# Patient Record
Sex: Female | Born: 1980 | Hispanic: No | Marital: Married | Smoking: Never smoker
Health system: Southern US, Community
[De-identification: ages and names within clinical notes are randomized; demographics above are authoritative.]

## PROBLEM LIST (undated history)

## (undated) DIAGNOSIS — R1011 Right upper quadrant pain: Secondary | ICD-10-CM

## (undated) HISTORY — PX: ORTHOPEDIC SURGERY: SHX850

## (undated) HISTORY — PX: TUBAL LIGATION: SHX77

## (undated) HISTORY — DX: Right upper quadrant pain: R10.11

---

## 2014-02-11 DIAGNOSIS — E559 Vitamin D deficiency, unspecified: Secondary | ICD-10-CM | POA: Insufficient documentation

## 2014-07-02 ENCOUNTER — Encounter: Payer: Self-pay | Admitting: Internal Medicine

## 2014-07-02 DIAGNOSIS — E039 Hypothyroidism, unspecified: Secondary | ICD-10-CM | POA: Insufficient documentation

## 2014-07-02 DIAGNOSIS — R1011 Right upper quadrant pain: Secondary | ICD-10-CM | POA: Insufficient documentation

## 2014-07-02 DIAGNOSIS — R634 Abnormal weight loss: Secondary | ICD-10-CM | POA: Insufficient documentation

## 2014-07-02 DIAGNOSIS — D485 Neoplasm of uncertain behavior of skin: Secondary | ICD-10-CM | POA: Insufficient documentation

## 2014-07-02 HISTORY — DX: Right upper quadrant pain: R10.11

## 2014-07-06 ENCOUNTER — Ambulatory Visit
Admit: 2014-07-06 | Disposition: A | Discharge status: Home / Self Care | Payer: Self-pay | Attending: Internal Medicine | Admitting: Internal Medicine

## 2014-09-02 ENCOUNTER — Ambulatory Visit (INDEPENDENT_AMBULATORY_CARE_PROVIDER_SITE_OTHER): Payer: PRIVATE HEALTH INSURANCE | Admitting: Internal Medicine

## 2014-09-02 ENCOUNTER — Other Ambulatory Visit: Payer: Self-pay | Admitting: Internal Medicine

## 2014-09-02 ENCOUNTER — Encounter: Payer: Self-pay | Admitting: Internal Medicine

## 2014-09-02 VITALS — BP 98/60 | HR 72 | Ht 62.0 in | Wt 135.8 lb

## 2014-09-02 DIAGNOSIS — Z0000 Encounter for general adult medical examination without abnormal findings: Secondary | ICD-10-CM | POA: Diagnosis not present

## 2014-09-02 DIAGNOSIS — E559 Vitamin D deficiency, unspecified: Secondary | ICD-10-CM

## 2014-09-02 DIAGNOSIS — E039 Hypothyroidism, unspecified: Secondary | ICD-10-CM

## 2014-09-02 LAB — POCT URINALYSIS DIPSTICK
BILIRUBIN UA: NEGATIVE
GLUCOSE UA: NEGATIVE
Ketones, UA: NEGATIVE
Leukocytes, UA: NEGATIVE
Nitrite, UA: NEGATIVE
Protein, UA: NEGATIVE
SPEC GRAV UA: 1.01
UROBILINOGEN UA: 0.2
pH, UA: 5

## 2014-09-02 NOTE — Progress Notes (Signed)
Date:  09/02/2014   Name:  Meghan Stewart   DOB:  [birth year]   MRN:  [id number]   Chief Complaint: Annual Exam Meghan Stewart is a 34 y.o. female who presents today for her Complete Annual Exam. She feels well. She reports exercising every day - walking and yoga. She reports she is sleeping well.  She is concerned that she does not lose weight despite diet and exercise.  Especially bothered by excess loose skin on her abdomen. Hypothyroidism - followed by endocrinology.  Recent TSH was normal and dose of medication is unchanged.  Also had a vitamin D level which was low so she started high dose vitamin D2 weekly. Review of Systems:  Review of Systems  Constitutional: Negative for fever, activity change, appetite change and unexpected weight change.  HENT: Negative for ear pain, hearing loss, trouble swallowing and voice change.   Eyes: Negative for visual disturbance.  Respiratory: Negative for chest tightness and shortness of breath.   Cardiovascular: Negative for chest pain and leg swelling.  Gastrointestinal: Positive for abdominal pain (occasional gas). Negative for nausea, diarrhea and abdominal distention.  Genitourinary: Negative for dysuria, menstrual problem and pelvic pain.  Musculoskeletal: Negative for back pain, joint swelling and gait problem.  Neurological: Positive for headaches (occasional mild).  Hematological: Negative for adenopathy.  Psychiatric/Behavioral: Negative for dysphoric mood.    Patient Active Problem List   Diagnosis Date Noted  . Acquired hypothyroidism 07/02/2014  . Abdominal pain, right upper quadrant 07/02/2014  . Neoplasm of uncertain behavior of skin 07/02/2014  . Avitaminosis D 02/11/2014    Prior to Admission medications   Medication Sig Start Date End Date Taking? Authorizing Provider  Calcium Carb-Cholecalciferol (CALCIUM + D3) 600-200 MG-UNIT TABS Take 1 tablet by mouth daily.    Historical Provider, MD  Fish Oil-Cholecalciferol (FISH OIL + D3)  1000-1000 MG-UNIT CAPS Take 1 capsule by mouth daily.    Historical Provider, MD  levothyroxine (SYNTHROID, LEVOTHROID) 88 MCG tablet Take 1 tablet by mouth daily.    Historical Provider, MD  Vitamin D, Ergocalciferol, (DRISDOL) 50000 UNITS CAPS capsule Take 1 capsule by mouth once a week. 08/26/14   Historical Provider, MD    No Known Allergies  Past Surgical History  Procedure Laterality Date  . Cesarean section      x 2  . Tubal ligation      History  Substance Use Topics  . Smoking status: Never Smoker   . Smokeless tobacco: Not on file  . Alcohol Use: No     Medication list has been reviewed and updated.  Physical Examination:  Physical Exam  Constitutional: She is oriented to person, place, and time. She appears well-developed. No distress.  HENT:  Head: Normocephalic and atraumatic.  Eyes: Conjunctivae are normal. Pupils are equal, round, and reactive to light. Right eye exhibits no discharge. Left eye exhibits no discharge. No scleral icterus.  Neck: Normal range of motion. Neck supple. No thyromegaly present.  Cardiovascular: Normal rate, regular rhythm, normal heart sounds and intact distal pulses.   Pulmonary/Chest: Effort normal and breath sounds normal. No respiratory distress. She has no wheezes. She has no rales.  Abdominal: Soft. Bowel sounds are normal. She exhibits no distension and no mass. There is no tenderness. There is no guarding.  Genitourinary: Vagina normal and uterus normal. No breast swelling, tenderness, discharge or bleeding. Cervix exhibits no motion tenderness, no discharge and no friability. Right adnexum displays no mass and no tenderness. Left adnexum displays no mass and  no tenderness.  Musculoskeletal: Normal range of motion. She exhibits no edema.  Lymphadenopathy:    She has no cervical adenopathy.    She has no axillary adenopathy.  Neurological: She is alert and oriented to person, place, and time. She has normal reflexes.  Skin: Skin  is warm, dry and intact. No rash noted.  Psychiatric: She has a normal mood and affect. Her speech is normal and behavior is normal. Thought content normal.    BP 98/60 mmHg  Pulse 72  Ht 5\' 2"  (1.575 m)  Wt 135 lb 12.8 oz (61.598 kg)  BMI 24.83 kg/m2  LMP 08/28/2014  Assessment and Plan: 1. Annual physical exam Normal exam - POCT urinalysis dipstick - CBC with Differential/Platelet - Comprehensive metabolic panel - Lipid panel - Pap IG (Image Guided)  2. Avitaminosis D supplemented  3. Acquired hypothyroidism Followed by Endocrinology   Halina Maidens, MD [facility]  09/02/2014

## 2014-09-02 NOTE — Patient Instructions (Signed)

## 2014-09-03 LAB — LIPID PANEL
CHOL/HDL RATIO: 4.5 ratio — AB (ref 0.0–4.4)
Cholesterol, Total: 178 mg/dL (ref 100–199)
HDL: 40 mg/dL (ref >39)
LDL Calculated: 119 mg/dL — ABNORMAL HIGH (ref 0–99)
Triglycerides: 97 mg/dL (ref 0–149)
VLDL Cholesterol Cal: 19 mg/dL (ref 5–40)

## 2014-09-03 LAB — CBC WITH DIFFERENTIAL/PLATELET
Basophils Absolute: 0 10*3/uL (ref 0.0–0.2)
Basos: 0 %
EOS (ABSOLUTE): 0.1 10*3/uL (ref 0.0–0.4)
Eos: 1 %
HEMATOCRIT: 40.9 % (ref 34.0–46.6)
Hemoglobin: 13.4 g/dL (ref 11.1–15.9)
IMMATURE GRANULOCYTES: 0 %
Immature Grans (Abs): 0 10*3/uL (ref 0.0–0.1)
LYMPHS ABS: 1.9 10*3/uL (ref 0.7–3.1)
LYMPHS: 37 %
MCH: 30.1 pg (ref 26.6–33.0)
MCHC: 32.8 g/dL (ref 31.5–35.7)
MCV: 92 fL (ref 79–97)
Monocytes Absolute: 0.4 10*3/uL (ref 0.1–0.9)
Monocytes: 7 %
NEUTROS ABS: 2.7 10*3/uL (ref 1.4–7.0)
Neutrophils: 55 %
Platelets: 146 10*3/uL — ABNORMAL LOW (ref 150–379)
RBC: 4.45 x10E6/uL (ref 3.77–5.28)
RDW: 14.1 % (ref 12.3–15.4)
WBC: 4.9 10*3/uL (ref 3.4–10.8)

## 2014-09-03 LAB — COMPREHENSIVE METABOLIC PANEL
ALT: 10 IU/L (ref 0–32)
AST: 12 IU/L (ref 0–40)
Albumin/Globulin Ratio: 1.7 (ref 1.1–2.5)
Albumin: 4.3 g/dL (ref 3.5–5.5)
Alkaline Phosphatase: 59 IU/L (ref 39–117)
BUN/Creatinine Ratio: 15 (ref 8–20)
BUN: 8 mg/dL (ref 6–20)
Bilirubin Total: 0.4 mg/dL (ref 0.0–1.2)
CALCIUM: 8.9 mg/dL (ref 8.7–10.2)
CO2: 22 mmol/L (ref 18–29)
CREATININE: 0.55 mg/dL — AB (ref 0.57–1.00)
Chloride: 100 mmol/L (ref 97–108)
GFR calc Af Amer: 140 mL/min/{1.73_m2} (ref >59)
GFR calc non Af Amer: 121 mL/min/{1.73_m2} (ref >59)
GLOBULIN, TOTAL: 2.6 g/dL (ref 1.5–4.5)
Glucose: 86 mg/dL (ref 65–99)
Potassium: 3.7 mmol/L (ref 3.5–5.2)
Sodium: 139 mmol/L (ref 134–144)
Total Protein: 6.9 g/dL (ref 6.0–8.5)

## 2014-09-04 LAB — PAP IG (IMAGE GUIDED): PAP Smear Comment: 0

## 2015-03-13 HISTORY — PX: OTHER SURGICAL HISTORY: SHX169

## 2017-01-15 DIAGNOSIS — E559 Vitamin D deficiency, unspecified: Secondary | ICD-10-CM | POA: Diagnosis not present

## 2017-01-15 DIAGNOSIS — E039 Hypothyroidism, unspecified: Secondary | ICD-10-CM | POA: Diagnosis not present

## 2017-01-17 DIAGNOSIS — E559 Vitamin D deficiency, unspecified: Secondary | ICD-10-CM | POA: Diagnosis not present

## 2017-01-17 DIAGNOSIS — E039 Hypothyroidism, unspecified: Secondary | ICD-10-CM | POA: Diagnosis not present

## 2018-01-20 DIAGNOSIS — E039 Hypothyroidism, unspecified: Secondary | ICD-10-CM | POA: Diagnosis not present

## 2018-01-20 DIAGNOSIS — E559 Vitamin D deficiency, unspecified: Secondary | ICD-10-CM | POA: Diagnosis not present

## 2018-01-24 DIAGNOSIS — E559 Vitamin D deficiency, unspecified: Secondary | ICD-10-CM | POA: Diagnosis not present

## 2018-01-24 DIAGNOSIS — E039 Hypothyroidism, unspecified: Secondary | ICD-10-CM | POA: Diagnosis not present

## 2018-04-08 ENCOUNTER — Other Ambulatory Visit (HOSPITAL_COMMUNITY)
Admission: RE | Admit: 2018-04-08 | Discharge: 2018-04-08 | Disposition: A | Discharge status: Home / Self Care | Payer: BLUE CROSS/BLUE SHIELD | Source: Ambulatory Visit | Attending: Internal Medicine | Admitting: Internal Medicine

## 2018-04-08 ENCOUNTER — Encounter: Payer: Self-pay | Admitting: Internal Medicine

## 2018-04-08 ENCOUNTER — Ambulatory Visit (INDEPENDENT_AMBULATORY_CARE_PROVIDER_SITE_OTHER): Payer: BLUE CROSS/BLUE SHIELD | Admitting: Internal Medicine

## 2018-04-08 VITALS — BP 112/64 | HR 101 | Ht 62.0 in | Wt 136.0 lb

## 2018-04-08 DIAGNOSIS — Z0000 Encounter for general adult medical examination without abnormal findings: Secondary | ICD-10-CM | POA: Diagnosis not present

## 2018-04-08 DIAGNOSIS — Z124 Encounter for screening for malignant neoplasm of cervix: Secondary | ICD-10-CM | POA: Insufficient documentation

## 2018-04-08 DIAGNOSIS — K5901 Slow transit constipation: Secondary | ICD-10-CM

## 2018-04-08 DIAGNOSIS — M722 Plantar fascial fibromatosis: Secondary | ICD-10-CM | POA: Diagnosis not present

## 2018-04-08 DIAGNOSIS — E039 Hypothyroidism, unspecified: Secondary | ICD-10-CM

## 2018-04-08 DIAGNOSIS — E559 Vitamin D deficiency, unspecified: Secondary | ICD-10-CM

## 2018-04-08 LAB — POCT URINALYSIS DIPSTICK
Bilirubin, UA: NEGATIVE
GLUCOSE UA: NEGATIVE
KETONES UA: NEGATIVE
LEUKOCYTES UA: NEGATIVE
Protein, UA: NEGATIVE
SPEC GRAV UA: 1.015 (ref 1.010–1.025)
Urobilinogen, UA: 0.2 E.U./dL
pH, UA: 5 (ref 5.0–8.0)

## 2018-04-08 NOTE — Patient Instructions (Signed)

## 2018-04-08 NOTE — Progress Notes (Signed)
Date:  04/08/2018   Name:  Meghan Stewart   DOB:  02-13-1981   MRN:  [id number]   Chief Complaint: Annual Exam Meghan Stewart is a 38 y.o. female who presents today for her Complete Annual Exam. She feels well. She reports exercising regularly. She reports she is sleeping well. She is due for a pap smear.  She has never had a mammogram.  She denies breast issues.  Her menses are regular - she has had a BTL.  Thyroid Problem  Presents for follow-up visit. Symptoms include constipation. Patient reports no anxiety, diarrhea, fatigue, palpitations or tremors. (Pt sees Endocrinology regularly for hypothyroidism.) The symptoms have been stable.  Foot Injury   The incident occurred at home. The injury mechanism was a twisting injury. The pain is present in the left foot. The pain is mild. The pain has been intermittent since onset. She reports no foreign bodies present. She has tried nothing (pain under left heel) for the symptoms.    Review of Systems  Constitutional: Negative for chills, fatigue and fever.  HENT: Negative for congestion, hearing loss, tinnitus, trouble swallowing and voice change.   Eyes: Negative for visual disturbance.  Respiratory: Negative for cough, chest tightness, shortness of breath and wheezing.   Cardiovascular: Negative for chest pain, palpitations and leg swelling.  Gastrointestinal: Positive for constipation. Negative for abdominal pain, diarrhea and vomiting.  Endocrine: Negative for polydipsia and polyuria.  Genitourinary: Negative for dysuria, frequency, genital sores, vaginal bleeding and vaginal discharge.  Musculoskeletal: Positive for arthralgias (pain on heel of left foot). Negative for gait problem and joint swelling.  Skin: Negative for color change and rash.  Neurological: Negative for dizziness, tremors, light-headedness and headaches.  Hematological: Negative for adenopathy. Does not bruise/bleed easily.  Psychiatric/Behavioral: Negative for dysphoric  mood and sleep disturbance. The patient is not nervous/anxious.     Patient Active Problem List   Diagnosis Date Noted  . Acquired hypothyroidism 07/02/2014  . Neoplasm of uncertain behavior of skin 07/02/2014  . Avitaminosis D 02/11/2014    No Known Allergies  Past Surgical History:  Procedure Laterality Date  . CESAREAN SECTION     x 2  . TUBAL LIGATION    . tummy tuck  2017    Social History   Tobacco Use  . Smoking status: Never Smoker  . Smokeless tobacco: Never Used  Substance Use Topics  . Alcohol use: Yes    Alcohol/week: 0.0 standard drinks    Comment: rarely   . Drug use: No     Medication list has been reviewed and updated.  Current Meds  Medication Sig  . Calcium Carb-Cholecalciferol (CALCIUM + D3) 600-200 MG-UNIT TABS Take 1 tablet by mouth daily.  . Fish Oil-Cholecalciferol (FISH OIL + D3) 1000-1000 MG-UNIT CAPS Take 1 capsule by mouth daily.  . Multiple Vitamins-Minerals (MULTIVITAMIN WITH MINERALS) tablet Take 1 tablet by mouth daily.    PHQ 2/9 Scores 04/08/2018  PHQ - 2 Score 0    Physical Exam Vitals signs and nursing note reviewed.  Constitutional:      General: She is not in acute distress.    Appearance: She is well-developed.  HENT:     Head: Normocephalic and atraumatic.     Right Ear: Tympanic membrane and ear canal normal.     Left Ear: Tympanic membrane and ear canal normal.     Nose:     Right Sinus: No maxillary sinus tenderness.     Left Sinus: No maxillary sinus  tenderness.     Mouth/Throat:     Pharynx: Uvula midline.  Eyes:     General: No scleral icterus.       Right eye: No discharge.        Left eye: No discharge.     Conjunctiva/sclera: Conjunctivae normal.  Neck:     Musculoskeletal: Normal range of motion. No erythema.     Thyroid: No thyromegaly.     Vascular: No carotid bruit.  Cardiovascular:     Rate and Rhythm: Normal rate and regular rhythm.     Pulses: Normal pulses.     Heart sounds: Normal heart  sounds.  Pulmonary:     Effort: Pulmonary effort is normal. No respiratory distress.     Breath sounds: No wheezing.  Chest:     Breasts:        Right: No mass, nipple discharge, skin change or tenderness.        Left: No mass, nipple discharge, skin change or tenderness.  Abdominal:     General: Bowel sounds are normal.     Palpations: Abdomen is soft.     Tenderness: There is no abdominal tenderness.     Hernia: There is no hernia in the right inguinal area or left inguinal area.  Genitourinary:    Labia:        Right: No rash, tenderness or lesion.        Left: No rash, tenderness or lesion.      Vagina: Normal.     Cervix: No friability or erythema.     Uterus: Normal.      Adnexa: Right adnexa normal and left adnexa normal.     Comments: Pap obtained Musculoskeletal: Normal range of motion.       Feet:  Lymphadenopathy:     Cervical: No cervical adenopathy.  Skin:    General: Skin is warm and dry.     Findings: No rash.  Neurological:     Mental Status: She is alert and oriented to person, place, and time.     Cranial Nerves: No cranial nerve deficit.     Sensory: No sensory deficit.     Deep Tendon Reflexes: Reflexes are normal and symmetric.  Psychiatric:        Attention and Perception: Attention normal.        Mood and Affect: Mood normal.        Speech: Speech normal.        Behavior: Behavior normal.        Thought Content: Thought content normal.     BP 112/64   Pulse (!) 101   Ht 5\' 2"  (1.575 m)   Wt 136 lb (61.7 kg)   LMP 04/03/2018 (Exact Date)   SpO2 98%   BMI 24.87 kg/m   Assessment and Plan: 1. Annual physical exam Normal exam; continue healthy diet and exercise Will need mammogram at age 56 - CBC with Differential/Platelet - Comprehensive metabolic panel - Lipid panel - POCT urinalysis dipstick  2. Acquired hypothyroidism supplemented  3. Avitaminosis D On supplemen  4. Encounter for Papanicolaou smear for cervical cancer  screening Obtained today - Cytology - PAP  5. Slow transit constipation Continue Miralax daily with water and fiber  6. Plantar fasciitis of left foot Recommend ice massage twice a day as needed   Partially dictated using Editor, commissioning. Any errors are unintentional.  Halina Maidens, MD [facility]  04/08/2018

## 2018-04-09 LAB — COMPREHENSIVE METABOLIC PANEL
ALT: 25 IU/L (ref 0–32)
AST: 20 IU/L (ref 0–40)
Albumin/Globulin Ratio: 1.9 (ref 1.2–2.2)
Albumin: 4.7 g/dL (ref 3.8–4.8)
Alkaline Phosphatase: 73 IU/L (ref 39–117)
BILIRUBIN TOTAL: 0.3 mg/dL (ref 0.0–1.2)
BUN/Creatinine Ratio: 23 (ref 9–23)
BUN: 14 mg/dL (ref 6–20)
CALCIUM: 9.4 mg/dL (ref 8.7–10.2)
CO2: 24 mmol/L (ref 20–29)
Chloride: 98 mmol/L (ref 96–106)
Creatinine, Ser: 0.62 mg/dL (ref 0.57–1.00)
GFR, EST AFRICAN AMERICAN: 131 mL/min/{1.73_m2} (ref >59)
GFR, EST NON AFRICAN AMERICAN: 114 mL/min/{1.73_m2} (ref >59)
Globulin, Total: 2.5 g/dL (ref 1.5–4.5)
Glucose: 106 mg/dL — ABNORMAL HIGH (ref 65–99)
Potassium: 3.9 mmol/L (ref 3.5–5.2)
Sodium: 138 mmol/L (ref 134–144)
TOTAL PROTEIN: 7.2 g/dL (ref 6.0–8.5)

## 2018-04-09 LAB — CBC WITH DIFFERENTIAL/PLATELET
BASOS ABS: 0 10*3/uL (ref 0.0–0.2)
Basos: 0 %
EOS (ABSOLUTE): 0.1 10*3/uL (ref 0.0–0.4)
Eos: 1 %
Hematocrit: 39.4 % (ref 34.0–46.6)
Hemoglobin: 13.7 g/dL (ref 11.1–15.9)
IMMATURE GRANS (ABS): 0 10*3/uL (ref 0.0–0.1)
IMMATURE GRANULOCYTES: 0 %
LYMPHS: 24 %
Lymphocytes Absolute: 1.7 10*3/uL (ref 0.7–3.1)
MCH: 31.4 pg (ref 26.6–33.0)
MCHC: 34.8 g/dL (ref 31.5–35.7)
MCV: 90 fL (ref 79–97)
Monocytes Absolute: 0.4 10*3/uL (ref 0.1–0.9)
Monocytes: 6 %
NEUTROS PCT: 69 %
Neutrophils Absolute: 5.1 10*3/uL (ref 1.4–7.0)
PLATELETS: 193 10*3/uL (ref 150–450)
RBC: 4.37 x10E6/uL (ref 3.77–5.28)
RDW: 13.1 % (ref 11.7–15.4)
WBC: 7.4 10*3/uL (ref 3.4–10.8)

## 2018-04-09 LAB — LIPID PANEL
Chol/HDL Ratio: 5 ratio — ABNORMAL HIGH (ref 0.0–4.4)
Cholesterol, Total: 233 mg/dL — ABNORMAL HIGH (ref 100–199)
HDL: 47 mg/dL (ref >39)
LDL Calculated: 162 mg/dL — ABNORMAL HIGH (ref 0–99)
Triglycerides: 119 mg/dL (ref 0–149)
VLDL CHOLESTEROL CAL: 24 mg/dL (ref 5–40)

## 2018-04-10 LAB — CYTOLOGY - PAP
Diagnosis: NEGATIVE
HPV (WINDOPATH): NOT DETECTED

## 2018-08-13 ENCOUNTER — Ambulatory Visit: Payer: BLUE CROSS/BLUE SHIELD | Admitting: Internal Medicine

## 2018-08-13 ENCOUNTER — Ambulatory Visit: Payer: BC Managed Care – PPO | Admitting: Internal Medicine

## 2018-09-24 ENCOUNTER — Encounter: Payer: Self-pay | Admitting: Internal Medicine

## 2018-09-24 ENCOUNTER — Other Ambulatory Visit: Payer: Self-pay

## 2018-09-24 ENCOUNTER — Ambulatory Visit (INDEPENDENT_AMBULATORY_CARE_PROVIDER_SITE_OTHER): Payer: BC Managed Care – PPO | Admitting: Internal Medicine

## 2018-09-24 VITALS — BP 128/66 | HR 84 | Ht 62.0 in | Wt 132.0 lb

## 2018-09-24 DIAGNOSIS — E559 Vitamin D deficiency, unspecified: Secondary | ICD-10-CM

## 2018-09-24 DIAGNOSIS — E039 Hypothyroidism, unspecified: Secondary | ICD-10-CM

## 2018-09-24 NOTE — Progress Notes (Signed)
Date:  09/24/2018   Name:  Meghan Stewart   DOB:  03-08-1981   MRN:  [id number]   Chief Complaint: Hypothyroidism (Patient see's NP at [facility] for Endocrinology. ) She no longer wants to go to endo since her labs and thyroid dose have been stable.  Thyroid Problem Presents for follow-up visit. Patient reports no constipation, depressed mood, fatigue, hair loss, heat intolerance, leg swelling, palpitations or weight gain. The symptoms have been stable.   Vitamin D def - last visit levels were 26.  Since then she added an additional Vitamin D3 1000 IU daily.  No results found for: TSH last lab 1.018 at [facility]  Review of Systems  Constitutional: Negative for chills, fatigue, unexpected weight change and weight gain.  Respiratory: Negative for cough, chest tightness, shortness of breath and wheezing.   Cardiovascular: Negative for chest pain, palpitations and leg swelling.  Gastrointestinal: Negative for constipation.  Endocrine: Negative for heat intolerance.  Musculoskeletal: Negative for arthralgias and myalgias.  Neurological: Negative for dizziness, light-headedness and headaches.    Patient Active Problem List   Diagnosis Date Noted  . Slow transit constipation 04/08/2018  . Plantar fasciitis of left foot 04/08/2018  . Acquired hypothyroidism 07/02/2014  . Neoplasm of uncertain behavior of skin 07/02/2014  . Avitaminosis D 02/11/2014    No Known Allergies  Past Surgical History:  Procedure Laterality Date  . CESAREAN SECTION     x 2  . TUBAL LIGATION    . tummy tuck  2017    Social History   Tobacco Use  . Smoking status: Never Smoker  . Smokeless tobacco: Never Used  Substance Use Topics  . Alcohol use: Yes    Alcohol/week: 0.0 standard drinks    Comment: rarely   . Drug use: No     Medication list has been reviewed and updated.  Current Meds  Medication Sig  . Calcium Carb-Cholecalciferol (CALCIUM + D3) 600-200 MG-UNIT TABS Take 1 tablet by mouth  daily.  . Fish Oil-Cholecalciferol (FISH OIL + D3) 1000-1000 MG-UNIT CAPS Take 1 capsule by mouth daily.  Marland Kitchen levothyroxine (SYNTHROID) 75 MCG tablet Take 1 tablet by mouth daily.  . Multiple Vitamins-Minerals (MULTIVITAMIN WITH MINERALS) tablet Take 1 tablet by mouth daily.  . Vitamin D, Ergocalciferol, (DRISDOL) 50000 UNITS CAPS capsule Take 1 capsule by mouth once a week.    PHQ 2/9 Scores 09/24/2018 04/08/2018  PHQ - 2 Score 0 0  PHQ- 9 Score 0 -    BP Readings from Last 3 Encounters:  09/24/18 128/66  04/08/18 112/64  09/02/14 98/60    Physical Exam Vitals signs and nursing note reviewed.  Constitutional:      General: She is not in acute distress.    Appearance: Normal appearance. She is well-developed.  HENT:     Head: Normocephalic and atraumatic.  Neck:     Musculoskeletal: Normal range of motion.     Vascular: No carotid bruit.  Cardiovascular:     Rate and Rhythm: Normal rate and regular rhythm.     Pulses: Normal pulses.     Heart sounds: No murmur.  Pulmonary:     Effort: Pulmonary effort is normal. No respiratory distress.     Breath sounds: No wheezing or rhonchi.  Musculoskeletal:     Left elbow: She exhibits decreased range of motion (and increased muscle tone LUE).     Left wrist: She exhibits deformity (post surgical changes to wrist and base of thumb, 6th digit laterally).  Right lower leg: No edema.     Left lower leg: No edema.  Lymphadenopathy:     Cervical: No cervical adenopathy.  Skin:    General: Skin is warm and dry.     Findings: No rash.  Neurological:     Mental Status: She is alert and oriented to person, place, and time.     Deep Tendon Reflexes:     Reflex Scores:      Bicep reflexes are 2+ on the right side and 1+ on the left side.      Patellar reflexes are 2+ on the right side and 2+ on the left side. Psychiatric:        Behavior: Behavior normal.        Thought Content: Thought content normal.     Wt Readings from Last 3  Encounters:  09/24/18 132 lb (59.9 kg)  04/08/18 136 lb (61.7 kg)  09/02/14 135 lb 12.8 oz (61.6 kg)    BP 128/66   Pulse 84   Ht 5\' 2"  (1.575 m)   Wt 132 lb (59.9 kg)   LMP 09/08/2018 (Exact Date)   SpO2 100%   BMI 24.14 kg/m   Assessment and Plan: 1. Acquired hypothyroidism Continue current dose of levothyroxine - will notify pt if dose change is needed - Thyroid Panel With TSH  2. Avitaminosis D Continue daily supplementation - VITAMIN D 25 Hydroxy (Vit-D Deficiency, Fractures)   Partially dictated using Editor, commissioning. Any errors are unintentional.  Halina Maidens, MD [facility]  09/24/2018

## 2018-09-25 LAB — THYROID PANEL WITH TSH
Free Thyroxine Index: 2.3 (ref 1.2–4.9)
T3 Uptake Ratio: 24 % (ref 24–39)
T4, Total: 9.6 ug/dL (ref 4.5–12.0)
TSH: 2.42 u[IU]/mL (ref 0.450–4.500)

## 2018-09-25 LAB — VITAMIN D 25 HYDROXY (VIT D DEFICIENCY, FRACTURES): Vit D, 25-Hydroxy: 34.8 ng/mL (ref 30.0–100.0)

## 2019-01-26 ENCOUNTER — Other Ambulatory Visit: Payer: Self-pay

## 2019-01-26 ENCOUNTER — Other Ambulatory Visit: Payer: Self-pay | Admitting: Internal Medicine

## 2019-01-26 DIAGNOSIS — E039 Hypothyroidism, unspecified: Secondary | ICD-10-CM

## 2019-01-26 MED ORDER — LEVOTHYROXINE SODIUM 75 MCG PO TABS: 75.0000 ug | ORAL_TABLET | Freq: Every day | ORAL | 3 refills | Status: DC

## 2019-04-10 ENCOUNTER — Ambulatory Visit (INDEPENDENT_AMBULATORY_CARE_PROVIDER_SITE_OTHER): Payer: 59 | Admitting: Internal Medicine

## 2019-04-10 ENCOUNTER — Other Ambulatory Visit: Payer: Self-pay

## 2019-04-10 ENCOUNTER — Encounter: Payer: Self-pay | Admitting: Internal Medicine

## 2019-04-10 VITALS — BP 104/68 | HR 87 | Temp 98.1°F | Ht 62.0 in | Wt 132.0 lb

## 2019-04-10 DIAGNOSIS — E039 Hypothyroidism, unspecified: Secondary | ICD-10-CM

## 2019-04-10 DIAGNOSIS — J9801 Acute bronchospasm: Secondary | ICD-10-CM

## 2019-04-10 DIAGNOSIS — Z1231 Encounter for screening mammogram for malignant neoplasm of breast: Secondary | ICD-10-CM | POA: Diagnosis not present

## 2019-04-10 DIAGNOSIS — Z0000 Encounter for general adult medical examination without abnormal findings: Secondary | ICD-10-CM | POA: Diagnosis not present

## 2019-04-10 LAB — POCT URINALYSIS DIPSTICK
Bilirubin, UA: NEGATIVE
Glucose, UA: NEGATIVE
Ketones, UA: NEGATIVE
Leukocytes, UA: NEGATIVE
Nitrite, UA: NEGATIVE
Protein, UA: NEGATIVE
Spec Grav, UA: 1.02 (ref 1.010–1.025)
Urobilinogen, UA: 0.2 E.U./dL
pH, UA: 6 (ref 5.0–8.0)

## 2019-04-10 NOTE — Patient Instructions (Signed)
Mammogram A mammogram is a low energy X-ray of the breasts that is done to check for abnormal changes. This procedure can screen for and detect any changes that may indicate breast cancer. Mammograms are regularly done on women. A man may have a mammogram if he has a lump or swelling in his breast. A mammogram can also identify other changes and variations in the breast, such as:  Inflammation of the breast tissue (mastitis).  An infected area that contains a collection of pus (abscess).  A fluid-filled sac (cyst).  Fibrocystic changes. This is when breast tissue becomes denser, which can make the tissue feel rope-like or uneven under the skin.  Tumors that are not cancerous (benign). Tell a health care provider:  About any allergies you have.  If you have breast implants.  If you have had previous breast disease, biopsy, or surgery.  If you are breastfeeding.  If you are younger than age 80.  If you have a family history of breast cancer.  Whether you are pregnant or may be pregnant. What are the risks? Generally, this is a safe procedure. However, problems may occur, including:  Exposure to radiation. Radiation levels are very low with this test.  The results being misinterpreted.  The need for further tests.  The inability of the mammogram to detect certain cancers. What happens before the procedure?  Schedule your test about 1-2 weeks after your menstrual period if you are still menstruating. This is usually when your breasts are the least tender.  If you have had a mammogram done at a different facility in the past, get the mammogram X-rays or have them sent to your current exam facility. The new and old images will be compared.  Wash your breasts and underarms on the day of the test.  Do not wear deodorants, perfumes, lotions, or powders anywhere on your body on the day of the test.  Remove any jewelry from your neck.  Wear clothes that you can change into and  out of easily. What happens during the procedure?   You will undress from the waist up and put on a gown that opens in the front.  You will stand in front of the X-ray machine.  Each breast will be placed between two plastic or glass plates. The plates will compress your breast for a few seconds. Try to stay as relaxed as possible during the procedure. This does not cause any harm to your breasts and any discomfort you feel will be very brief.  X-rays will be taken from different angles of each breast. The procedure may vary among health care providers and hospitals. What happens after the procedure?  The mammogram will be examined by a specialist (radiologist).  You may need to repeat certain parts of the test, depending on the quality of the images. This is commonly done if the radiologist needs a better view of the breast tissue.  You may resume your normal activities.  It is up to you to get the results of your procedure. Ask your health care provider, or the department that is doing the procedure, when your results will be ready. Summary  A mammogram is a low energy X-ray of the breasts that is done to check for abnormal changes. A man may have a mammogram if he has a lump or swelling in his breast.  If you have had a mammogram done at a different facility in the past, get the mammogram X-rays or have them sent to your  current exam facility in order to compare them.  Schedule your test about 1-2 weeks after your menstrual period if you are still menstruating.  For this test, each breast will be placed between two plastic or glass plates. The plates will compress your breast for a few seconds.  Ask when your test results will be ready. Make sure you get your test results. This information is not intended to replace advice given to you by your health care provider. Make sure you discuss any questions you have with your health care provider. Document Revised: 10/17/2017 Document  Reviewed: 10/17/2017 Elsevier Patient Education  2020 [employer]  

## 2019-04-10 NOTE — Progress Notes (Signed)
Date:  04/10/2019   Name:  Meghan Stewart   DOB:  01/17/1981   MRN:  [id number]   Chief Complaint: Annual Exam (Breast Exam. ), Hyperlipidemia (Lipid recheck.), and Hypothyroidism (TSH Check) Meghan Stewart is a 39 y.o. female who presents today for her Complete Annual Exam. She feels well. She reports exercising regularly. She reports she is sleeping well. She denies breast issues.  Mammogram - none Pap 03/2018 neg with cotesting Immunization History  Administered Date(s) Administered  . Influenza,inj,Quad PF,6+ Mos 01/15/2018  . Influenza-Unspecified 01/30/2019    Shortness of Breath This is a recurrent problem. Episode onset: since childhood. The problem occurs intermittently (about once a month). The problem has been unchanged. The average episode lasts 1 hour. Pertinent negatives include no abdominal pain, chest pain, fever, headaches, leg swelling, rash, vomiting or wheezing. Exacerbated by: exercise in cold air. Treatments tried: warm liquids. The treatment provided significant relief.  Thyroid Problem Presents for follow-up visit. Patient reports no anxiety, constipation, depressed mood, diarrhea, fatigue, heat intolerance, palpitations, tremors, visual change or weight gain. The symptoms have been stable.    Lab Results  Component Value Date   CREATININE 0.62 04/08/2018   BUN 14 04/08/2018   NA 138 04/08/2018   K 3.9 04/08/2018   CL 98 04/08/2018   CO2 24 04/08/2018   Lab Results  Component Value Date   CHOL 233 (H) 04/08/2018   HDL 47 04/08/2018   LDLCALC 162 (H) 04/08/2018   TRIG 119 04/08/2018   CHOLHDL 5.0 (H) 04/08/2018   Lab Results  Component Value Date   TSH 2.420 09/24/2018   No results found for: HGBA1C   Review of Systems  Constitutional: Negative for chills, fatigue, fever and weight gain.  HENT: Negative for congestion, hearing loss, tinnitus, trouble swallowing and voice change.   Eyes: Negative for visual disturbance.  Respiratory: Positive for  shortness of breath. Negative for cough, chest tightness and wheezing.   Cardiovascular: Negative for chest pain, palpitations and leg swelling.  Gastrointestinal: Negative for abdominal pain, constipation, diarrhea and vomiting.  Endocrine: Negative for heat intolerance, polydipsia and polyuria.  Genitourinary: Negative for dysuria, frequency, genital sores, vaginal bleeding and vaginal discharge.  Musculoskeletal: Negative for arthralgias, gait problem and joint swelling.  Skin: Negative for color change and rash.  Allergic/Immunologic: Negative for environmental allergies and food allergies.  Neurological: Negative for dizziness, tremors, light-headedness and headaches.  Hematological: Negative for adenopathy. Does not bruise/bleed easily.  Psychiatric/Behavioral: Negative for dysphoric mood and sleep disturbance. The patient is not nervous/anxious.     Patient Active Problem List   Diagnosis Date Noted  . Slow transit constipation 04/08/2018  . Plantar fasciitis of left foot 04/08/2018  . Acquired hypothyroidism 07/02/2014  . Neoplasm of uncertain behavior of skin 07/02/2014  . Avitaminosis D 02/11/2014    No Known Allergies  Past Surgical History:  Procedure Laterality Date  . CESAREAN SECTION     x 2  . 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 TUBAL LIGATION    . tummy tuck  2017    Social History   Tobacco Use  . Smoking status: Never Smoker  . Smokeless tobacco: Never Used  Substance Use Topics  . Alcohol use: Yes    Alcohol/week: 0.0 standard drinks    Comment: rarely   . Drug use: No     Medication list has been reviewed and updated.  Current Meds  Medication Sig  .  Calcium Carb-Cholecalciferol (CALCIUM + D3) 600-200 MG-UNIT TABS Take 1 tablet by mouth daily.  . Cholecalciferol (EQL VITAMIN D3) 25 MCG (1000 UT) capsule Take 1,000 Units by mouth daily.  . Fish Oil-Cholecalciferol (FISH OIL + D3) 1000-1000 MG-UNIT CAPS  Take 1 capsule by mouth daily.  Marland Kitchen levothyroxine (SYNTHROID) 75 MCG tablet Take 1 tablet (75 mcg total) by mouth daily.  . Multiple Vitamins-Minerals (MULTIVITAMIN WITH MINERALS) tablet Take 1 tablet by mouth daily.    PHQ 2/9 Scores 04/10/2019 09/24/2018 04/08/2018  PHQ - 2 Score 0 0 0  PHQ- 9 Score - 0 -    BP Readings from Last 3 Encounters:  04/10/19 104/68  09/24/18 128/66  04/08/18 112/64    Physical Exam Vitals and nursing note reviewed.  Constitutional:      General: She is not in acute distress.    Appearance: She is well-developed.  HENT:     Head: Normocephalic and atraumatic.     Right Ear: Tympanic membrane and ear canal normal.     Left Ear: Tympanic membrane and ear canal normal.     Nose:     Right Sinus: No maxillary sinus tenderness.     Left Sinus: No maxillary sinus tenderness.  Eyes:     General: No scleral icterus.       Right eye: No discharge.        Left eye: No discharge.     Conjunctiva/sclera: Conjunctivae normal.  Neck:     Thyroid: No thyroid mass, thyromegaly or thyroid tenderness.     Vascular: No carotid bruit.  Cardiovascular:     Rate and Rhythm: Normal rate and regular rhythm.     Pulses: Normal pulses.     Heart sounds: Normal heart sounds.  Pulmonary:     Effort: Pulmonary effort is normal. No respiratory distress.     Breath sounds: Normal breath sounds. No decreased air movement. No decreased breath sounds, wheezing or rhonchi.  Chest:     Breasts:        Right: No mass, nipple discharge, skin change or tenderness.        Left: No mass, nipple discharge, skin change or tenderness.  Abdominal:     General: Bowel sounds are normal.     Palpations: Abdomen is soft.     Tenderness: There is no abdominal tenderness.  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     Cervical back: Normal range of motion. No erythema.  Lymphadenopathy:     Cervical: No cervical adenopathy.   Skin:    General: Skin is warm and dry.     Capillary Refill: Capillary refill takes less than 2 seconds.     Findings: No rash.  Neurological:     General: No focal deficit present.     Mental Status: She is alert and oriented to person, place, and time.     Cranial Nerves: No cranial nerve deficit.     Sensory: No sensory deficit.     Deep Tendon Reflexes: Reflexes are normal and symmetric.  Psychiatric:        Attention and Perception: Attention normal.        Mood and Affect: Mood normal.        Speech: Speech normal.        Behavior: Behavior normal.        Thought Content: Thought content normal.     Wt Readings from Last 3 Encounters:  04/10/19 132 lb (59.9 kg)  09/24/18 132 lb (59.9 kg)  04/08/18 136 lb (61.7 kg)    BP 104/68   Pulse 87   Temp 98.1 F (36.7 C) (Oral)   Ht 5\' 2"  (1.575 m)   Wt 132 lb (59.9 kg)   LMP 03/26/2019 (Exact Date)   SpO2 99%   BMI 24.14 kg/m   Assessment and Plan: 1. Annual physical exam Normal exam Continue regular exercise and healthy diet - CBC with Differential/Platelet - Comprehensive metabolic panel - Lipid panel - POCT urinalysis dipstick  2. Encounter for screening mammogram for breast cancer Begin annual screenings - MM 3D SCREEN BREAST BILATERAL; Future  3. Acquired hypothyroidism supplemented - TSH + free T4  4. Cold induced bronchospasm Pt reports brief episodes occurring once a month without other worrisome features Recommend warm liquids as needed Follow up if symptoms increase in frequency or duration   Partially dictated using Editor, commissioning. Any errors are unintentional.  Halina Maidens, MD [facility]  04/10/2019

## 2019-04-11 LAB — LIPID PANEL
Chol/HDL Ratio: 4 ratio (ref 0.0–4.4)
Cholesterol, Total: 201 mg/dL — ABNORMAL HIGH (ref 100–199)
HDL: 50 mg/dL (ref >39)
LDL Chol Calc (NIH): 136 mg/dL — ABNORMAL HIGH (ref 0–99)
Triglycerides: 83 mg/dL (ref 0–149)
VLDL Cholesterol Cal: 15 mg/dL (ref 5–40)

## 2019-04-11 LAB — CBC WITH DIFFERENTIAL/PLATELET
Basophils Absolute: 0.1 10*3/uL (ref 0.0–0.2)
Basos: 1 %
EOS (ABSOLUTE): 0.1 10*3/uL (ref 0.0–0.4)
Eos: 1 %
Hematocrit: 44.4 % (ref 34.0–46.6)
Hemoglobin: 14.9 g/dL (ref 11.1–15.9)
Immature Grans (Abs): 0 10*3/uL (ref 0.0–0.1)
Immature Granulocytes: 0 %
Lymphocytes Absolute: 1.9 10*3/uL (ref 0.7–3.1)
Lymphs: 35 %
MCH: 30.7 pg (ref 26.6–33.0)
MCHC: 33.6 g/dL (ref 31.5–35.7)
MCV: 92 fL (ref 79–97)
Monocytes Absolute: 0.4 10*3/uL (ref 0.1–0.9)
Monocytes: 6 %
Neutrophils Absolute: 3.2 10*3/uL (ref 1.4–7.0)
Neutrophils: 57 %
Platelets: 215 10*3/uL (ref 150–450)
RBC: 4.85 x10E6/uL (ref 3.77–5.28)
RDW: 12.8 % (ref 11.7–15.4)
WBC: 5.6 10*3/uL (ref 3.4–10.8)

## 2019-04-11 LAB — COMPREHENSIVE METABOLIC PANEL
ALT: 22 IU/L (ref 0–32)
AST: 25 IU/L (ref 0–40)
Albumin/Globulin Ratio: 2 (ref 1.2–2.2)
Albumin: 4.7 g/dL (ref 3.8–4.8)
Alkaline Phosphatase: 52 IU/L (ref 39–117)
BUN/Creatinine Ratio: 12 (ref 9–23)
BUN: 8 mg/dL (ref 6–24)
Bilirubin Total: 0.4 mg/dL (ref 0.0–1.2)
CO2: 22 mmol/L (ref 20–29)
Calcium: 9.5 mg/dL (ref 8.7–10.2)
Chloride: 103 mmol/L (ref 96–106)
Creatinine, Ser: 0.68 mg/dL (ref 0.57–1.00)
GFR calc Af Amer: 127 mL/min/{1.73_m2} (ref >59)
GFR calc non Af Amer: 110 mL/min/{1.73_m2} (ref >59)
Globulin, Total: 2.4 g/dL (ref 1.5–4.5)
Glucose: 89 mg/dL (ref 65–99)
Potassium: 4.4 mmol/L (ref 3.5–5.2)
Sodium: 138 mmol/L (ref 134–144)
Total Protein: 7.1 g/dL (ref 6.0–8.5)

## 2019-04-11 LAB — TSH+FREE T4
Free T4: 1.54 ng/dL (ref 0.82–1.77)
TSH: 3.03 u[IU]/mL (ref 0.450–4.500)

## 2019-04-14 ENCOUNTER — Other Ambulatory Visit: Payer: Self-pay

## 2019-04-14 ENCOUNTER — Ambulatory Visit
Admission: RE | Admit: 2019-04-14 | Discharge: 2019-04-14 | Disposition: A | Discharge status: Home / Self Care | Payer: 59 | Source: Ambulatory Visit | Attending: Internal Medicine | Admitting: Internal Medicine

## 2019-04-14 DIAGNOSIS — Z1231 Encounter for screening mammogram for malignant neoplasm of breast: Secondary | ICD-10-CM

## 2020-03-29 ENCOUNTER — Ambulatory Visit: Payer: Self-pay | Admitting: Internal Medicine

## 2020-03-29 NOTE — Telephone Encounter (Signed)
Pt. Called to report increase in menstrual cycles recently. Reported cycle on 12/30-1/3, and then started menstruating again on 1/13 to present. Reported flow is "normal and at times heavier".  Changing pads about every 3-4 hrs.  Reported she had lower abdominal and back pain yesterday for short interval.  Denied any pain today.  Denied passing any clots or tissue.  Hx of tubal ligation.  Denied personal hx. PCOS or uterine fibroids.  Denied light-headed feeling or weakness. Denied fever.  Reported has had COVID vaccines with booster shot taken 01/27/20.  Appt. Made with PCP 04/12/20.  Care advice given per protocol.  Encouraged to call back with worsening symptoms or prolonged bleeding.  Husband and wife verb. Understanding and agreed with plan.        Reason for Disposition . [1] Menstrual cycle < 21 days OR > 35 days AND [2] has occurred once this past year  Answer Assessment - Initial Assessment Questions 1. AMOUNT: "Describe the bleeding that you are having."    - SPOTTING: spotting, or pinkish / brownish mucous discharge; does not fill panti-liner or pad    - MILD:  less than 1 pad / hour; less than patient's usual menstrual bleeding   - MODERATE: 1-2 pads / hour; 1 menstrual cup every 6 hours; small-medium blood clots (e.g., pea, grape, small coin)   - SEVERE: soaking 2 or more pads/hour for 2 or more hours; 1 menstrual cup every 2 hours; bleeding not contained by pads or continuous red blood from vagina; large blood clots (e.g., golf ball, large coin)      Sometimes normal and sometimes heavy; changing pad every 3-4 hrs.  2. ONSET: "When did the bleeding begin?" "Is it continuing now?"     03/24/20 3. MENSTRUAL PERIOD: "When was the last normal menstrual period?" "How is this different than your period?"     12/30 4. REGULARITY: "How regular are your periods?"     Every 28 days 5. ABDOMINAL PAIN: "Do you have any pain?" "How bad is the pain?"  (e.g., Scale 1-10; mild, moderate, or  severe)   - MILD (1-3): doesn't interfere with normal activities, abdomen soft and not tender to touch    - MODERATE (4-7): interferes with normal activities or awakens from sleep, tender to touch    - SEVERE (8-10): excruciating pain, doubled over, unable to do any normal activities      C/o lower abdominal pain and back pain yesterday; mild 6. PREGNANCY: "Could you be pregnant?" "Are you sexually active?" "Did you recently give birth?"     Having increase in menstrual cycles; 12/30-1/3 and 1/13- today 7. BREASTFEEDING: "Are you breastfeeding?"     n/a 8. HORMONES: "Are you taking any hormone medications, prescription or OTC?" (e.g., birth control pills, estrogen)     No 9. BLOOD THINNERS: "Do you take any blood thinners?" (e.g., Coumadin/warfarin, Pradaxa/dabigatran, aspirin)     no 10. CAUSE: "What do you think is causing the bleeding?" (e.g., recent gyn surgery, recent gyn procedure; known bleeding disorder, cervical cancer, polycystic ovarian disease, fibroids)         no 11. HEMODYNAMIC STATUS: "Are you weak or feeling lightheaded?" If Yes, ask: "Can you stand and walk normally?"       No; denied  12. OTHER SYMPTOMS: "What other symptoms are you having with the bleeding?" (e.g., passed tissue, vaginal discharge, fever, menstrual-type cramps)       Denied any clots, tissue, fever, denied cramps  Protocols used: VAGINAL BLEEDING -  ABNORMAL-A-AH

## 2020-03-31 ENCOUNTER — Other Ambulatory Visit: Payer: Self-pay

## 2020-03-31 ENCOUNTER — Ambulatory Visit
Admission: EM | Admit: 2020-03-31 | Discharge: 2020-03-31 | Disposition: A | Discharge status: Home / Self Care | Payer: 59 | Attending: Internal Medicine | Admitting: Internal Medicine

## 2020-03-31 DIAGNOSIS — D696 Thrombocytopenia, unspecified: Secondary | ICD-10-CM | POA: Diagnosis not present

## 2020-03-31 DIAGNOSIS — E010 Iodine-deficiency related diffuse (endemic) goiter: Secondary | ICD-10-CM

## 2020-03-31 DIAGNOSIS — N939 Abnormal uterine and vaginal bleeding, unspecified: Secondary | ICD-10-CM

## 2020-03-31 LAB — CBC WITH DIFFERENTIAL/PLATELET
Abs Immature Granulocytes: 0.02 10*3/uL (ref 0.00–0.07)
Basophils Absolute: 0 10*3/uL (ref 0.0–0.1)
Basophils Relative: 1 %
Eosinophils Absolute: 0 10*3/uL (ref 0.0–0.5)
Eosinophils Relative: 1 %
HCT: 41.9 % (ref 36.0–46.0)
Hemoglobin: 14.1 g/dL (ref 12.0–15.0)
Immature Granulocytes: 0 %
Lymphocytes Relative: 22 %
Lymphs Abs: 1.5 10*3/uL (ref 0.7–4.0)
MCH: 30.4 pg (ref 26.0–34.0)
MCHC: 33.7 g/dL (ref 30.0–36.0)
MCV: 90.3 fL (ref 80.0–100.0)
Monocytes Absolute: 0.4 10*3/uL (ref 0.1–1.0)
Monocytes Relative: 6 %
Neutro Abs: 4.6 10*3/uL (ref 1.7–7.7)
Neutrophils Relative %: 70 %
Platelets: 148 10*3/uL — ABNORMAL LOW (ref 150–400)
RBC: 4.64 MIL/uL (ref 3.87–5.11)
RDW: 13.2 % (ref 11.5–15.5)
WBC: 6.5 10*3/uL (ref 4.0–10.5)
nRBC: 0 % (ref 0.0–0.2)

## 2020-03-31 LAB — PREGNANCY, URINE: Preg Test, Ur: NEGATIVE

## 2020-03-31 LAB — TSH: TSH: 2.709 u[IU]/mL (ref 0.350–4.500)

## 2020-03-31 LAB — T4, FREE: Free T4: 1.01 ng/dL (ref 0.61–1.12)

## 2020-03-31 NOTE — ED Provider Notes (Signed)
[facility]    CSN: [id number] Arrival date & time: 03/31/20  0955      History   Chief Complaint Chief Complaint  Patient presents with  . Menstrual Problem    HPI Meghan Stewart is a 40 y.o. female who presents due to having abnormal periods. LMP 12/27 which lasted to 1/3, which was longer than her normal. Normally lasts 3 days. Then on 1/13 she started bleeding again, and is still bleeding now which heavier than her period. Has been having cramps as well. Been taking Aleeve and Tylenol. She has been taking her thyroid medication daily without missing any doses. Will see her PCP next month.   Has had Covid Moderna injections series last year, and had a booster 01/27/20 Has had normal paps in the past, and has not had pelvic US other than having kids.  Denies of any new Rx meds or supplements in the past month.  Her PCP is out of town the rest of this month and cant see her til next month. Pt does not have a gynecologist.   Past Medical History:  Diagnosis Date  . Abdominal pain, right upper quadrant 07/02/2014    Patient Active Problem List   Diagnosis Date Noted  . 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  . Slow transit constipation 04/08/2018  . Plantar fasciitis of left foot 04/08/2018  . Acquired hypothyroidism 07/02/2014  . Neoplasm of uncertain behavior of skin 07/02/2014  . Avitaminosis D 02/11/2014    Past Surgical History:  Procedure Laterality Date  . CESAREAN SECTION     x 2  . 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 TUBAL LIGATION    . tummy tuck  2017    OB History   No obstetric history on file.      Home Medications    Prior to Admission medications   Medication Sig Start Date End Date Taking? Authorizing Provider  Calcium Carb-Cholecalciferol (CALCIUM + D3) 600-200 MG-UNIT TABS Take 1 tablet by mouth daily.   Yes [provider]  Cholecalciferol (EQL VITAMIN D3) 25 MCG (1000 UT) capsule Take  1,000 Units by mouth daily.   Yes [provider]  Fish Oil-Cholecalciferol (FISH OIL + D3) 1000-1000 MG-UNIT CAPS Take 1 capsule by mouth daily.   Yes [provider]  levothyroxine (SYNTHROID) 75 MCG tablet Take 1 tablet (75 mcg total) by mouth daily. 01/26/19  Yes Glean Hess, MD  Multiple Vitamins-Minerals (MULTIVITAMIN WITH MINERALS) tablet Take 1 tablet by mouth daily.   Yes [provider]    Family History Family History  Problem Relation Age of Onset  . Ovarian cancer Maternal Grandmother   . CAD Father 67  . Breast cancer Neg Hx     Social History Social History   Tobacco Use  . Smoking status: Never Smoker  . Smokeless tobacco: Never Used  Vaping Use  . Vaping Use: Never used  Substance Use Topics  . Alcohol use: Yes    Alcohol/week: 0.0 standard drinks    Comment: rarely   . Drug use: No     Allergies   Patient has no known allergies.   Review of Systems Review of Systems  Constitutional: Positive for chills, diaphoresis and fatigue. Negative for appetite change and fever.  Gastrointestinal: Positive for abdominal pain.  Endocrine: Negative for cold intolerance and heat intolerance.       Has felt hot off and on, not associated with  bleeding  Also has had sweats not related to bleeding  Denies more hair loss than her usual  Genitourinary: Positive for pelvic pain and vaginal bleeding. Negative for difficulty urinating, dysuria, vaginal discharge and vaginal pain.  Skin: Negative for color change and rash.  Neurological: Negative for weakness.   Physical Exam Triage Vital Signs ED Triage Vitals  Enc Vitals Group     BP 03/31/20 1041 104/73     Pulse Rate 03/31/20 1041 (!) 104     Resp 03/31/20 1041 18     Temp 03/31/20 1041 98.7 F (37.1 C)     Temp Source 03/31/20 1041 Oral     SpO2 03/31/20 1041 98 %     Weight 03/31/20 1038 136 lb 11 oz (62 kg)     Height 03/31/20 1038 5\' 2"  (1.575 m)     Head Circumference --       Peak Flow --      Pain Score 03/31/20 1038 0     Pain Loc --      Pain Edu? --      Excl. in [place]? --    No data found.  Updated Vital Signs BP 104/73 (BP Location: Left Arm)   Pulse (!) 104   Temp 98.7 F (37.1 C) (Oral)   Resp 18   Ht 5\' 2"  (1.575 m)   Wt 136 lb 11 oz (62 kg)   LMP 03/24/2020   SpO2 98%   BMI 25.00 kg/m   Visual Acuity Right Eye Distance:   Left Eye Distance:   Bilateral Distance:    Right Eye Near:   Left Eye Near:    Bilateral Near:     Physical Exam Vitals and nursing note reviewed.  Constitutional:      General: She is not in acute distress.    Appearance: She is normal weight. She is not toxic-appearing.  HENT:     Head: Normocephalic.     Right Ear: External ear normal.     Left Ear: External ear normal.     Nose: Nose normal.  Eyes:     General: No scleral icterus.    Conjunctiva/sclera: Conjunctivae normal.  Neck:     Comments: + thyroidmegaly, and R lobe feels larger than L, she may  Pulmonary:     Effort: Pulmonary effort is normal.  Abdominal:     General: Bowel sounds are normal. There is no distension.     Palpations: Abdomen is soft. There is no mass.     Tenderness: There is no abdominal tenderness. There is no guarding or rebound.  Musculoskeletal:        General: Normal range of motion.     Cervical back: Neck supple.  Skin:    General: Skin is warm and dry.  Neurological:     Mental Status: She is alert and oriented to person, place, and time.     Gait: Gait normal.  Psychiatric:        Mood and Affect: Mood normal.        Behavior: Behavior normal.        Thought Content: Thought content normal.        Judgment: Judgment normal.     UC Treatments / Results  Labs (all labs ordered are listed, but only abnormal results are displayed) Labs Reviewed  CBC WITH DIFFERENTIAL/PLATELET - Abnormal; Notable for the following components:      Result Value   Platelets 148 (*)    All other  components within normal  limits  PREGNANCY, URINE  TSH  T4, FREE    EKG   Radiology No results found.  Procedures Procedures (including critical care time)  Medications Ordered in UC Medications - No data to display  Initial Impression / Assessment and Plan / UC Course  I have reviewed the triage vital signs and the nursing notes.as AUB with negative pregnancy test and slight thrombocytopenia.  Pertinent labs  results that were available during my care of the patient were reviewed by me and considered in my medical decision making (see chart for details). Her thyroid test is pending, and they will check on her results via mychart and FU with PCP.  Advised to d/c NSAID's and only take Tylenol for cramps.  Needs to Fu with GYN for further work up. Bother husband and pt were explained I can place her on anything to stop the bleeding til the cause if found out so we dont risk of masking a malignancy. They understand.  Final Clinical Impressions(s) / UC Diagnoses   Final diagnoses:  Abnormal uterine bleeding  Thyromegaly  Thrombocytopenia (HCC)     Discharge Instructions     Avoid taking any more Aleeve or Ibuprofen type medications, and only take Tylenol.  Call today to follow up with gynecology for the abnormal bleeding. If you develop weakness, and worse bleeding, go to the ER where they can repeat your blood work, and do more test than we can here , plus they can consult with the gynecologist on call.   [place] at [facility] [address] [place],  [place]  [address] Main: [place] [facility] [place], [place] [address] Office: [phone]  Your cell count does not show anemia and your pregnancy test is negative. Your platelets which are in charge of helping clot are a little low at 148 and looks you had low levels 5 years ago( 146) and your normal is somewhere from 196-215( normal is 150-400)  Follow  up with your family Dr about your thyroid enlargement for which you should get an ultrasound.     ED Prescriptions    None     PDMP not reviewed this encounter.   Shelby Mattocks, PA-C 03/31/20 1248

## 2020-03-31 NOTE — ED Triage Notes (Signed)
Patient states that she had a menstrual cycle from 12/27-1/3. States that then it came back on 1/13 and is still currently happening. States that it fluctuates from being heavy to light.

## 2020-03-31 NOTE — Discharge Instructions (Addendum)
Avoid taking any more Aleeve or Ibuprofen type medications, and only take Tylenol.  Call today to follow up with gynecology for the abnormal bleeding. If you develop weakness, and worse bleeding, go to the ER where they can repeat your blood work, and do more test than we can here , plus they can consult with the gynecologist on call.   [place] at [facility] [address] [place],  [place]  [address] Main: [place] [facility] [place], [place] [address] Office: [phone]  Your cell count does not show anemia and your pregnancy test is negative. Your platelets which are in charge of helping clot are a little low at 148 and looks you had low levels 5 years ago( 146) and your normal is somewhere from 196-215( normal is 150-400)  Follow up with your family Dr about your thyroid enlargement for which you should get an ultrasound.

## 2020-04-05 ENCOUNTER — Other Ambulatory Visit: Payer: Self-pay

## 2020-04-05 ENCOUNTER — Encounter: Payer: Self-pay | Admitting: Obstetrics and Gynecology

## 2020-04-05 ENCOUNTER — Ambulatory Visit (INDEPENDENT_AMBULATORY_CARE_PROVIDER_SITE_OTHER): Payer: 59 | Admitting: Obstetrics and Gynecology

## 2020-04-05 VITALS — BP 124/74 | Ht 62.0 in | Wt 145.0 lb

## 2020-04-05 DIAGNOSIS — N921 Excessive and frequent menstruation with irregular cycle: Secondary | ICD-10-CM | POA: Diagnosis not present

## 2020-04-05 NOTE — Progress Notes (Signed)
Obstetrics & Gynecology Office Visit   Chief Complaint  Patient presents with  . Menstrual Problem  . Follow-up   History of Present Illness: 40 y.o. G77P2002 female who presents in follow up from an ER visit on 03/31/2020.  Period started 03/07/2020 and lasted until 1/3.  Normally, her periods last 3-4 days.  She had another period start on 03/24/20 until 04/03/20.  With the second period she had pain and cramping in her lower abdomen and some in her back.  She took 2 Tylenol and Aleve and it helped with the cramps, but not the bleeding.  She was told not to take Aleve at Urgent Care due to her platelet count being slightly low at 148.  Prior to the end of December she had monthly periods lasting 3-4 days. She did have her Healy booster on 01/27/2020.  With the bleeding it has not been heavier than normal and she has passed no clots.  She notes no weight changes, new constipation, early satiety, bloating.   She did not have an ultrasound at Urgent Care.    Last pap: 03/2018: nilm, HPV negative  Past Medical History:  Diagnosis Date  . Abdominal pain, right upper quadrant 07/02/2014    Past Surgical History:  Procedure Laterality Date  . CESAREAN SECTION     x 2  . 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 TUBAL LIGATION    . tummy tuck  2017    Gynecologic History: Patient's last menstrual period was 03/24/2020.  Obstetric History: G2P2002, s/p c-section x 2, s/p BTL.  Family History  Problem Relation Age of Onset  . Ovarian cancer Maternal Grandmother   . CAD Father 75  . Breast cancer Neg Hx     Social History   Socioeconomic History  . Marital status: Married    Spouse name: Not on file  . Number of children: 2  . Years of education: Not on file  . Highest education level: Not on file  Occupational History  . Not on file  Tobacco Use  . Smoking status: Never Smoker  . Smokeless tobacco: Never Used  Vaping Use  . Vaping Use:  Never used  Substance and Sexual Activity  . Alcohol use: Yes    Alcohol/week: 0.0 standard drinks    Comment: rarely   . Drug use: No  . Sexual activity: Yes    Partners: Male  Other Topics Concern  . Not on file  Social History Narrative  . Not on file   Social Determinants of Health   Financial Resource Strain: Not on file  Food Insecurity: Not on file  Transportation Needs: Not on file  Physical Activity: Not on file  Stress: Not on file  Social Connections: Not on file  Intimate Partner Violence: Not on file   Allergies: No Known Allergies  Prior to Admission medications   Medication Sig Start Date End Date Taking? Authorizing Provider  levothyroxine (SYNTHROID) 75 MCG tablet Take 1 tablet (75 mcg total) by mouth daily. 01/26/19   Glean Hess, MD    Review of Systems  Constitutional: Negative.   HENT: Negative.   Eyes: Negative.   Respiratory: Negative.   Cardiovascular: Negative.   Gastrointestinal: Negative.   Genitourinary: Negative.   Musculoskeletal: Negative.   Skin: Negative.   Neurological: Negative.   Psychiatric/Behavioral: Negative.      Physical Exam BP 124/74   Ht 5\' 2"  (1.575 m)  Wt 145 lb (65.8 kg)   LMP 03/24/2020   BMI 26.52 kg/m  Patient's last menstrual period was 03/24/2020. Physical Exam Constitutional:      General: She is not in acute distress.    Appearance: Normal appearance. She is well-developed.  Genitourinary:     Vulva, bladder and urethral meatus normal.     Right Labia: No rash, tenderness, lesions, skin changes or Bartholin's cyst.    Left Labia: No tenderness, skin changes, Bartholin's cyst or rash.    No inguinal adenopathy present in the right or left side.    Pelvic Tanner Score: 5/5.     Right Adnexa: not tender, not full and no mass present.    Left Adnexa: not tender, not full and no mass present.    No cervical motion tenderness, friability, lesion or polyp.     Uterus is not enlarged, fixed or  tender.     Uterus is anteverted.     No urethral tenderness or mass present.     Pelvic exam was performed with patient in the lithotomy position.  HENT:     Head: Normocephalic and atraumatic.  Eyes:     General: No scleral icterus.    Conjunctiva/sclera: Conjunctivae normal.  Cardiovascular:     Rate and Rhythm: Normal rate and regular rhythm.     Heart sounds: No murmur heard. No friction rub. No gallop.   Pulmonary:     Effort: Pulmonary effort is normal. No respiratory distress.     Breath sounds: Normal breath sounds. No wheezing or rales.  Abdominal:     General: Bowel sounds are normal. There is no distension.     Palpations: Abdomen is soft. There is no mass.     Tenderness: There is no abdominal tenderness. There is no guarding or rebound.     Hernia: There is no hernia in the left inguinal area or right inguinal area.  Musculoskeletal:        General: Normal range of motion.     Cervical back: Normal range of motion and neck supple.  Lymphadenopathy:     Lower Body: No right inguinal adenopathy. No left inguinal adenopathy.  Neurological:     General: No focal deficit present.     Mental Status: She is alert and oriented to person, place, and time.     Cranial Nerves: No cranial nerve deficit.  Skin:    General: Skin is warm and dry.     Findings: No erythema.  Psychiatric:        Mood and Affect: Mood normal.        Behavior: Behavior normal.        Judgment: Judgment normal.    Female chaperone present for pelvic and breast  portions of the physical exam  Assessment: 40 y.o. No obstetric history on file. female here for  1. Menorrhagia with irregular cycle      Plan: Problem List Items Addressed This Visit   None   Visit Diagnoses    Menorrhagia with irregular cycle    -  Primary   Relevant Orders   US PELVIS TRANSVAGINAL NON-OB (TV ONLY)     The bleeding has been short-term. So, this may somehow be related to Covid vaccine.  Will obtain pelvic  ultrasound.  Will make ongoing treatment decisions based on this. Discussed overall workup and possible causes of her bleeding.  A total of 30 minutes were spent face-to-face with the patient as well as preparation, review, communication, and  documentation during this encounter.     Prentice Docker, MD 04/05/2020 4:43 PM

## 2020-04-12 ENCOUNTER — Ambulatory Visit: Payer: Self-pay | Admitting: Internal Medicine

## 2020-04-12 ENCOUNTER — Other Ambulatory Visit: Payer: Self-pay

## 2020-04-12 ENCOUNTER — Ambulatory Visit (INDEPENDENT_AMBULATORY_CARE_PROVIDER_SITE_OTHER): Payer: 59

## 2020-04-12 ENCOUNTER — Encounter: Payer: Self-pay | Admitting: Obstetrics and Gynecology

## 2020-04-12 ENCOUNTER — Other Ambulatory Visit: Payer: Self-pay | Admitting: Obstetrics and Gynecology

## 2020-04-12 ENCOUNTER — Ambulatory Visit (INDEPENDENT_AMBULATORY_CARE_PROVIDER_SITE_OTHER): Payer: 59 | Admitting: Obstetrics and Gynecology

## 2020-04-12 ENCOUNTER — Other Ambulatory Visit: Payer: Self-pay | Admitting: Internal Medicine

## 2020-04-12 VITALS — BP 118/74 | Ht 62.0 in | Wt 145.0 lb

## 2020-04-12 DIAGNOSIS — N921 Excessive and frequent menstruation with irregular cycle: Secondary | ICD-10-CM

## 2020-04-12 DIAGNOSIS — E039 Hypothyroidism, unspecified: Secondary | ICD-10-CM

## 2020-04-12 NOTE — Progress Notes (Signed)
Gynecology Ultrasound Follow Up  Chief Complaint:  Chief Complaint  Patient presents with  . Follow-up  U/S for menorrhagia with irregular cycle   History of Present Illness: Patient is a 40 y.o. female who presents today for ultrasound evaluation of the above .  Ultrasound demonstrates the following findings Adnexa: no masses seen  Uterus: retroverted with endometrial stripe  10.9 mm Additional: no abnormalities She has had no bleeding since her last appointment  Past Medical History:  Diagnosis Date  . Abdominal pain, right upper quadrant 07/02/2014    Past Surgical History:  Procedure Laterality Date  . CESAREAN SECTION     x 2  . 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 TUBAL LIGATION    . tummy tuck  2017     Family History  Problem Relation Age of Onset  . Ovarian cancer Maternal Grandmother   . CAD Father 81  . Breast cancer Neg Hx     Social History   Socioeconomic History  . Marital status: Married    Spouse name: Not on file  . Number of children: 2  . Years of education: Not on file  . Highest education level: Not on file  Occupational History  . Not on file  Tobacco Use  . Smoking status: Never Smoker  . Smokeless tobacco: Never Used  Vaping Use  . Vaping Use: Never used  Substance and Sexual Activity  . Alcohol use: Yes    Alcohol/week: 0.0 standard drinks    Comment: rarely   . Drug use: No  . Sexual activity: Yes    Partners: Male  Other Topics Concern  . Not on file  Social History Narrative  . Not on file   Social Determinants of Health   Financial Resource Strain: Not on file  Food Insecurity: Not on file  Transportation Needs: Not on file  Physical Activity: Not on file  Stress: Not on file  Social Connections: Not on file  Intimate Partner Violence: Not on file    No Known Allergies  Prior to Admission medications   Medication Sig Start Date End Date Taking? Authorizing Provider   Calcium Carb-Cholecalciferol (CALCIUM + D3) 600-200 MG-UNIT TABS Take 1 tablet by mouth daily.    [provider]  Cholecalciferol (EQL VITAMIN D3) 25 MCG (1000 UT) capsule Take 1,000 Units by mouth daily.    [provider]  Fish Oil-Cholecalciferol (FISH OIL + D3) 1000-1000 MG-UNIT CAPS Take 1 capsule by mouth daily.    [provider]  levothyroxine (SYNTHROID) 75 MCG tablet Take 1 tablet by mouth once daily 04/12/20   Glean Hess, MD  Multiple Vitamins-Minerals (MULTIVITAMIN WITH MINERALS) tablet Take 1 tablet by mouth daily.    [provider]    Physical Exam BP 118/74   Ht 5\' 2"  (1.575 m)   Wt 145 lb (65.8 kg)   LMP 03/24/2020   BMI 26.52 kg/m    General: NAD HEENT: normocephalic, anicteric Pulmonary: No increased work of breathing Extremities: no edema, erythema, or tenderness Neurologic: Grossly intact, normal gait Psychiatric: mood appropriate, affect full  Imaging Results US PELVIC COMPLETE WITH TRANSVAGINAL  Result Date: 04/12/2020 Patient Name: Jannessa Ogden DOB: 03-20-1980 MRN: [id number] ULTRASOUND REPORT Location: [facility] Date of Service: 04/12/2020 Indications:Menorrhagia and Irregular Menstrual Cycles Findings: The uterus is retroverted and measures 9.7 x 4.8 x 4.6 cm. Echo texture is homogenous without evidence of focal masses. The Endometrium  measures 10.9 mm. Right Ovary measures 3.5 x 2.1 x 2.7 cm. It is normal in appearance. There is a corpus luteum in the right ovary. Left Ovary measures 2.6 x 1.8 x 1.5 cm. It is normal in appearance. Survey of the adnexa demonstrates no adnexal masses. There is no free fluid in the cul de sac. Impression: 1. Normal pelvic ultrasound. Gweneth Dimitri, RT The ultrasound images and findings were reviewed by me and I agree with the above report. Prentice Docker, MD, [facility], [facility] 04/12/2020 2:51 PM      Assessment: 40 y.o. No obstetric history on file.  1.  Menorrhagia with irregular cycle      Plan: Problem List Items Addressed This Visit   None   Visit Diagnoses    Menorrhagia with irregular cycle    -  Primary     She had a normal ultrasound and we discussed conservative management, medication management, and surgical management.  For now she will continue to monitor.   A total of 20 minutes were spent face-to-face with the patient as well as preparation, review, communication, and documentation during this encounter.    Prentice Docker, MD, [facility], [facility] 04/12/2020 3:32 PM

## 2020-04-13 ENCOUNTER — Encounter: Payer: 59 | Admitting: Internal Medicine

## 2020-04-25 ENCOUNTER — Encounter: Payer: 59 | Admitting: Internal Medicine

## 2020-05-12 ENCOUNTER — Other Ambulatory Visit: Payer: Self-pay | Admitting: Internal Medicine

## 2020-05-12 DIAGNOSIS — E039 Hypothyroidism, unspecified: Secondary | ICD-10-CM

## 2020-10-12 ENCOUNTER — Ambulatory Visit (LOCAL_COMMUNITY_HEALTH_CENTER): Payer: 59 | Admitting: Nurse Practitioner

## 2020-10-12 ENCOUNTER — Other Ambulatory Visit: Payer: Self-pay

## 2020-10-12 DIAGNOSIS — Z23 Encounter for immunization: Secondary | ICD-10-CM | POA: Diagnosis not present

## 2020-10-12 NOTE — Progress Notes (Signed)
40 year old female in clinic today for immunizations. Gregary Cromer, RN

## 2020-11-09 ENCOUNTER — Ambulatory Visit (LOCAL_COMMUNITY_HEALTH_CENTER): Payer: 59

## 2020-11-09 ENCOUNTER — Other Ambulatory Visit: Payer: Self-pay

## 2020-11-09 DIAGNOSIS — Z7185 Encounter for immunization safety counseling: Secondary | ICD-10-CM

## 2020-11-09 NOTE — Progress Notes (Addendum)
In Nurse Clinic  with husband for Hep B and Varicella. No supply of recombinant Hep B adult vaccine (Recombivax) today.  Psychologist, prison and probation services Collene Leyden, RN notified. Shipment should arrive by Friday. Pt plans to return Friday 11/11/2020 at 4:30 pm for both vaccines. Pt signed ROI today in order to obtain Varicella titer results which were done at [facility] ([address] [place], [place] [address], phone [phone], fax [phone]). Fax sent successfully to [place] [employer]. Josie Saunders, RN  11/09/20- afternoon. Fax received from [place]. [employer] showing low Varicella titer results. Sent for scanning. Josie Saunders, RN

## 2020-11-11 ENCOUNTER — Ambulatory Visit (LOCAL_COMMUNITY_HEALTH_CENTER): Payer: 59

## 2020-11-11 ENCOUNTER — Other Ambulatory Visit: Payer: Self-pay

## 2020-11-11 DIAGNOSIS — Z23 Encounter for immunization: Secondary | ICD-10-CM

## 2020-12-19 ENCOUNTER — Ambulatory Visit: Payer: 59 | Admitting: Internal Medicine

## 2021-02-13 ENCOUNTER — Other Ambulatory Visit: Payer: Self-pay | Admitting: Internal Medicine

## 2021-02-13 DIAGNOSIS — E039 Hypothyroidism, unspecified: Secondary | ICD-10-CM

## 2021-02-14 NOTE — Telephone Encounter (Signed)
Requested medication (s) are due for refill today: requesting early, filled 90 day supply 01/11/21  Requested medication (s) are on the active medication list: yes  Last refill:  01/11/21  Future visit scheduled: 03/31/21  Notes to clinic:  last visit 04/10/19 Failed protocol due to no valid visit within 12  months, also requesting early, please assess.   Requested Prescriptions  Pending Prescriptions Disp Refills   levothyroxine (SYNTHROID) 75 MCG tablet [Pharmacy Med Name: Levothyroxine Sodium 75 MCG Oral Tablet] 90 tablet 0    Sig: Take 1 tablet by mouth once daily     Endocrinology:  Hypothyroid Agents Failed - 02/13/2021  3:58 PM      Failed - TSH needs to be rechecked within 3 months after an abnormal result. Refill until TSH is due.      Failed - Valid encounter within last 12 months    Recent Outpatient Visits           1 year ago Annual physical exam   [facility] Glean Hess, MD   2 years ago Acquired hypothyroidism   [facility] Glean Hess, MD   2 years ago Annual physical exam   [facility] Glean Hess, MD   6 years ago Annual physical exam   [facility] Glean Hess, MD       Future Appointments             In 1 month Glean Hess, MD [facility]            Passed - TSH in normal range and within 360 days    TSH  Date Value Ref Range Status  03/31/2020 2.709 0.350 - 4.500 uIU/mL Final    Comment:    Performed by a 3rd Generation assay with a functional sensitivity of <=0.01 uIU/mL. Performed at [facility], [address] [place], [place] [address]   04/10/2019 3.030 0.450 - 4.500 uIU/mL Final

## 2021-02-27 ENCOUNTER — Other Ambulatory Visit: Payer: Self-pay

## 2021-02-27 ENCOUNTER — Ambulatory Visit (LOCAL_COMMUNITY_HEALTH_CENTER): Payer: 59

## 2021-02-27 DIAGNOSIS — Z23 Encounter for immunization: Secondary | ICD-10-CM

## 2021-02-27 NOTE — Progress Notes (Signed)
In Nurse Clinic and tolerated Hep B #3 well today. Updated [facility] copy given. Josie Saunders, RN

## 2021-03-31 ENCOUNTER — Ambulatory Visit
Admission: RE | Admit: 2021-03-31 | Discharge: 2021-03-31 | Disposition: A | Discharge status: Home / Self Care | Payer: 59 | Source: Ambulatory Visit | Attending: Internal Medicine | Admitting: Internal Medicine

## 2021-03-31 ENCOUNTER — Ambulatory Visit (INDEPENDENT_AMBULATORY_CARE_PROVIDER_SITE_OTHER): Payer: 59 | Admitting: Internal Medicine

## 2021-03-31 ENCOUNTER — Ambulatory Visit
Admission: RE | Admit: 2021-03-31 | Discharge: 2021-03-31 | Disposition: A | Discharge status: Home / Self Care | Payer: 59 | Attending: Internal Medicine | Admitting: Internal Medicine

## 2021-03-31 ENCOUNTER — Other Ambulatory Visit: Payer: Self-pay

## 2021-03-31 ENCOUNTER — Encounter: Payer: Self-pay | Admitting: Internal Medicine

## 2021-03-31 VITALS — BP 96/68 | HR 88 | Ht 62.0 in | Wt 139.0 lb

## 2021-03-31 DIAGNOSIS — Z1159 Encounter for screening for other viral diseases: Secondary | ICD-10-CM

## 2021-03-31 DIAGNOSIS — E039 Hypothyroidism, unspecified: Secondary | ICD-10-CM | POA: Diagnosis not present

## 2021-03-31 DIAGNOSIS — M79644 Pain in right finger(s): Secondary | ICD-10-CM | POA: Diagnosis present

## 2021-03-31 DIAGNOSIS — Z1231 Encounter for screening mammogram for malignant neoplasm of breast: Secondary | ICD-10-CM | POA: Diagnosis not present

## 2021-03-31 DIAGNOSIS — Z0000 Encounter for general adult medical examination without abnormal findings: Secondary | ICD-10-CM | POA: Diagnosis not present

## 2021-03-31 DIAGNOSIS — E559 Vitamin D deficiency, unspecified: Secondary | ICD-10-CM

## 2021-03-31 DIAGNOSIS — Z114 Encounter for screening for human immunodeficiency virus [HIV]: Secondary | ICD-10-CM

## 2021-03-31 MED ORDER — LEVOTHYROXINE SODIUM 75 MCG PO TABS
75.0000 ug | ORAL_TABLET | Freq: Every day | ORAL | 1 refills | Status: DC
Start: 2021-03-31 — End: 2021-10-19

## 2021-03-31 NOTE — Patient Instructions (Signed)
Tylenol XS 500 mg take twice a day

## 2021-03-31 NOTE — Progress Notes (Signed)
Date:  03/31/2021   Name:  Meghan Stewart   DOB:  1981-01-02   MRN:  [id number]   Chief Complaint: Annual Exam (Breast Exam. No Pap.) Meghan Stewart is a 41 y.o. female who presents today for her Complete Annual Exam. She feels well. She reports exercising yoga and bycycling. She reports she is sleeping well. Breast complaints - none.  Mammogram: 04/2019 DEXA: none Pap smear: 03/2018 neg with co-testing Colonoscopy: none  Immunization History  Administered Date(s) Administered   Hepatitis B 10/12/2020, 02/27/2021   Hepatitis B, adult 11/11/2020   Influenza,inj,Quad PF,6+ Mos 01/15/2018   Influenza-Unspecified 01/30/2019, 01/10/2021   Tdap 10/12/2020   Varicella 10/12/2020, 11/11/2020    Thyroid Problem Presents for follow-up visit. Patient reports no anxiety, constipation, diarrhea, fatigue, palpitations or tremors. The symptoms have been stable.  Thumb pain - for more than 6 months, worse in the cold weather.  Uses Biofreeze and massage. 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 Lab Results  Component Value Date   NA 138 04/10/2019   K 4.4 04/10/2019   CO2 22 04/10/2019   GLUCOSE 89 04/10/2019   BUN 8 04/10/2019   CREATININE 0.68 04/10/2019   CALCIUM 9.5 04/10/2019   GFRNONAA 110 04/10/2019   Lab Results  Component Value Date   CHOL 201 (H) 04/10/2019   HDL 50 04/10/2019   LDLCALC 136 (H) 04/10/2019   TRIG 83 04/10/2019   CHOLHDL 4.0 04/10/2019   Lab Results  Component Value Date   TSH 2.709 03/31/2020   No results found for: HGBA1C Lab Results  Component Value Date   WBC 6.5 03/31/2020   HGB 14.1 03/31/2020   HCT 41.9 03/31/2020   MCV 90.3 03/31/2020   PLT 148 (L) 03/31/2020   Lab Results  Component Value Date   ALT 22 04/10/2019   AST 25 04/10/2019   ALKPHOS 52 04/10/2019   BILITOT 0.4 04/10/2019   Lab Results  Component Value Date   VD25OH 34.8 09/24/2018     Review of Systems  Constitutional:  Negative for chills,  fatigue and fever.  HENT:  Negative for congestion, hearing loss, tinnitus, trouble swallowing and voice change.   Eyes:  Negative for visual disturbance.  Respiratory:  Negative for cough, chest tightness, shortness of breath and wheezing.   Cardiovascular:  Negative for chest pain, palpitations and leg swelling.  Gastrointestinal:  Negative for abdominal pain, constipation, diarrhea and vomiting.  Endocrine: Negative for polydipsia and polyuria.  Genitourinary:  Negative for dysuria, frequency, genital sores, vaginal bleeding and vaginal discharge.  Musculoskeletal:  Positive for arthralgias (right thumb). Negative for gait problem and joint swelling.  Skin:  Negative for color change and rash.  Neurological:  Negative for dizziness, tremors, light-headedness and headaches.  Hematological:  Negative for adenopathy. Does not bruise/bleed easily.  Psychiatric/Behavioral:  Negative for dysphoric mood and sleep disturbance. The patient is not nervous/anxious.    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   Slow transit constipation 04/08/2018   Plantar fasciitis of left foot 04/08/2018   Acquired hypothyroidism 07/02/2014   Neoplasm of uncertain behavior of skin 07/02/2014   Avitaminosis D 02/11/2014    No Known Allergies  Past Surgical History:  Procedure Laterality Date   CESAREAN SECTION     x 2   ORTHOPEDIC SURGERY  1980's   to remove extra digit at base of left thumb   TUBAL LIGATION  tummy tuck  2017    Social History   Tobacco Use   Smoking status: Never   Smokeless tobacco: Never  Vaping Use   Vaping Use: Never used  Substance Use Topics   Alcohol use: Yes    Alcohol/week: 0.0 standard drinks    Comment: rarely    Drug use: No     Medication list has been reviewed and updated.  Current Meds  Medication Sig   Calcium Carb-Cholecalciferol (CALCIUM + D3) 600-200 MG-UNIT TABS Take 1 tablet by mouth daily.    Fish Oil-Cholecalciferol (FISH OIL + D3) 1000-1000 MG-UNIT CAPS Take 1 capsule by mouth daily.   levothyroxine (SYNTHROID) 75 MCG tablet Take 1 tablet by mouth once daily   Multiple Vitamins-Minerals (MULTIVITAMIN WITH MINERALS) tablet Take 1 tablet by mouth daily.    PHQ 2/9 Scores 03/31/2021 04/10/2019 09/24/2018 04/08/2018  PHQ - 2 Score 0 0 0 0  PHQ- 9 Score 0 - 0 -    GAD 7 : Generalized Anxiety Score 03/31/2021  Nervous, Anxious, on Edge 0  Control/stop worrying 1  Worry too much - different things 0  Trouble relaxing 0  Restless 0  Easily annoyed or irritable 0  Afraid - awful might happen 0  Total GAD 7 Score 1  Anxiety Difficulty Not difficult at all    BP Readings from Last 3 Encounters:  03/31/21 96/68  04/12/20 118/74  04/05/20 124/74    Physical Exam Vitals and nursing note reviewed.  Constitutional:      General: She is not in acute distress.    Appearance: Normal appearance. She is well-developed.  HENT:     Head: Normocephalic and atraumatic.     Right Ear: Tympanic membrane and ear canal normal.     Left Ear: Tympanic membrane and ear canal normal.     Nose:     Right Sinus: No maxillary sinus tenderness.     Left Sinus: No maxillary sinus tenderness.  Eyes:     General: No scleral icterus.       Right eye: No discharge.        Left eye: No discharge.     Conjunctiva/sclera: Conjunctivae normal.  Neck:     Thyroid: No thyromegaly.     Vascular: No carotid bruit.  Cardiovascular:     Rate and Rhythm: Normal rate and regular rhythm.     Pulses: Normal pulses.     Heart sounds: Normal heart sounds.  Pulmonary:     Effort: Pulmonary effort is normal. No respiratory distress.     Breath sounds: No wheezing.  Chest:  Breasts:    Right: No mass, nipple discharge, skin change or tenderness.     Left: No mass, nipple discharge, skin change or tenderness.  Abdominal:     General: Bowel sounds are normal.     Palpations: Abdomen is soft.     Tenderness:  There is no abdominal tenderness.  Musculoskeletal:     Right hand: Bony tenderness (at the base of the thumb) present.     Left hand: Deformity (6th digit, weakness, post surgical changes) present.     Cervical back: Normal range of motion. No erythema.     Right lower leg: No edema.     Left lower leg: No edema.  Lymphadenopathy:     Cervical: No cervical adenopathy.  Skin:    General: Skin is warm and dry.     Capillary Refill: Capillary refill takes less than 2 seconds.  Findings: No rash.  Neurological:     General: No focal deficit present.     Mental Status: She is alert and oriented to person, place, and time.     Cranial Nerves: No cranial nerve deficit.     Sensory: No sensory deficit.     Deep Tendon Reflexes: Reflexes are normal and symmetric.  Psychiatric:        Attention and Perception: Attention normal.        Mood and Affect: Mood normal.    Wt Readings from Last 3 Encounters:  03/31/21 139 lb (63 kg)  04/12/20 145 lb (65.8 kg)  04/05/20 145 lb (65.8 kg)    BP 96/68    Pulse 88    Ht 5\' 2"  (1.575 m)    Wt 139 lb (63 kg)    SpO2 99%    BMI 25.42 kg/m   Assessment and Plan: 1. Annual physical exam Normal exam. Continue healthy diet, regular exercise. Up to date on screenings and immunizations. - CBC with Differential/Platelet - Comprehensive metabolic panel - Lipid panel  2. Encounter for screening mammogram for breast cancer Due for annual screening - schedule at [facility] - MM 3D SCREEN BREAST BILATERAL  3. Need for hepatitis C screening test - Hepatitis C antibody  4. Acquired hypothyroidism Supplemented; pt reminded not to take Biotin within one month of labs - TSH + free T4 - levothyroxine (SYNTHROID) 75 MCG tablet; Take 1 tablet (75 mcg total) by mouth daily.  Dispense: 90 tablet; Refill: 1  5. Avitaminosis D Continue daily supplement - VITAMIN D 25 Hydroxy (Vit-D Deficiency, Fractures)  6. 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 Begin Tylenol bid; continue Biofreeze and massage - DG Finger Thumb Right  7. Encounter for screening for HIV - HIV Antibody (routine testing w rflx)   Partially dictated using Editor, commissioning. Any errors are unintentional.  Halina Maidens, MD [facility]  03/31/2021

## 2021-04-01 LAB — CBC WITH DIFFERENTIAL/PLATELET
Basophils Absolute: 0.1 10*3/uL (ref 0.0–0.2)
Basos: 1 %
EOS (ABSOLUTE): 0.1 10*3/uL (ref 0.0–0.4)
Eos: 1 %
Hematocrit: 41.7 % (ref 34.0–46.6)
Hemoglobin: 14.2 g/dL (ref 11.1–15.9)
Immature Grans (Abs): 0 10*3/uL (ref 0.0–0.1)
Immature Granulocytes: 0 %
Lymphocytes Absolute: 1.7 10*3/uL (ref 0.7–3.1)
Lymphs: 26 %
MCH: 30.5 pg (ref 26.6–33.0)
MCHC: 34.1 g/dL (ref 31.5–35.7)
MCV: 90 fL (ref 79–97)
Monocytes Absolute: 0.4 10*3/uL (ref 0.1–0.9)
Monocytes: 6 %
Neutrophils Absolute: 4.4 10*3/uL (ref 1.4–7.0)
Neutrophils: 66 %
Platelets: 182 10*3/uL (ref 150–450)
RBC: 4.66 x10E6/uL (ref 3.77–5.28)
RDW: 12.6 % (ref 11.7–15.4)
WBC: 6.7 10*3/uL (ref 3.4–10.8)

## 2021-04-01 LAB — LIPID PANEL
Chol/HDL Ratio: 5.2 ratio — ABNORMAL HIGH (ref 0.0–4.4)
Cholesterol, Total: 197 mg/dL (ref 100–199)
HDL: 38 mg/dL — ABNORMAL LOW (ref >39)
LDL Chol Calc (NIH): 139 mg/dL — ABNORMAL HIGH (ref 0–99)
Triglycerides: 107 mg/dL (ref 0–149)
VLDL Cholesterol Cal: 20 mg/dL (ref 5–40)

## 2021-04-01 LAB — COMPREHENSIVE METABOLIC PANEL
ALT: 9 IU/L (ref 0–32)
AST: 13 IU/L (ref 0–40)
Albumin/Globulin Ratio: 1.7 (ref 1.2–2.2)
Albumin: 4.3 g/dL (ref 3.8–4.8)
Alkaline Phosphatase: 66 IU/L (ref 44–121)
BUN/Creatinine Ratio: 14 (ref 9–23)
BUN: 10 mg/dL (ref 6–24)
Bilirubin Total: 0.5 mg/dL (ref 0.0–1.2)
CO2: 22 mmol/L (ref 20–29)
Calcium: 9.2 mg/dL (ref 8.7–10.2)
Chloride: 101 mmol/L (ref 96–106)
Creatinine, Ser: 0.72 mg/dL (ref 0.57–1.00)
Globulin, Total: 2.5 g/dL (ref 1.5–4.5)
Glucose: 89 mg/dL (ref 70–99)
Potassium: 4.4 mmol/L (ref 3.5–5.2)
Sodium: 138 mmol/L (ref 134–144)
Total Protein: 6.8 g/dL (ref 6.0–8.5)
eGFR: 107 mL/min/{1.73_m2} (ref >59)

## 2021-04-01 LAB — TSH+FREE T4
Free T4: 1.58 ng/dL (ref 0.82–1.77)
TSH: 2.84 u[IU]/mL (ref 0.450–4.500)

## 2021-04-01 LAB — HEPATITIS C ANTIBODY: Hep C Virus Ab: <0.1 s/co ratio (ref 0.0–0.9)

## 2021-04-01 LAB — HIV ANTIBODY (ROUTINE TESTING W REFLEX): HIV Screen 4th Generation wRfx: NONREACTIVE

## 2021-04-01 LAB — VITAMIN D 25 HYDROXY (VIT D DEFICIENCY, FRACTURES): Vit D, 25-Hydroxy: 48.3 ng/mL (ref 30.0–100.0)

## 2021-04-02 ENCOUNTER — Encounter: Payer: Self-pay | Admitting: Internal Medicine

## 2021-04-02 DIAGNOSIS — E785 Hyperlipidemia, unspecified: Secondary | ICD-10-CM | POA: Insufficient documentation

## 2021-04-06 ENCOUNTER — Telehealth: Payer: Self-pay

## 2021-04-06 NOTE — Telephone Encounter (Signed)
Called pt as a reminder to call and schedule mammogram. [phone]  [facility] nurse may give results to patient if they return call to clinic, a CRM has been created.  KP

## 2021-06-22 ENCOUNTER — Ambulatory Visit
Admission: RE | Admit: 2021-06-22 | Discharge: 2021-06-22 | Disposition: A | Discharge status: Home / Self Care | Payer: 59 | Source: Ambulatory Visit | Attending: Internal Medicine | Admitting: Internal Medicine

## 2021-06-22 DIAGNOSIS — Z1231 Encounter for screening mammogram for malignant neoplasm of breast: Secondary | ICD-10-CM | POA: Diagnosis present

## 2021-10-19 ENCOUNTER — Other Ambulatory Visit: Payer: Self-pay | Admitting: Internal Medicine

## 2021-10-19 DIAGNOSIS — E039 Hypothyroidism, unspecified: Secondary | ICD-10-CM

## 2021-10-19 NOTE — Telephone Encounter (Signed)
Requested Prescriptions  Pending Prescriptions Disp Refills  . levothyroxine (SYNTHROID) 75 MCG tablet [Pharmacy Med Name: Levothyroxine Sodium 75 MCG Oral Tablet] 90 tablet 1    Sig: Take 1 tablet by mouth once daily     Endocrinology:  Hypothyroid Agents Passed - 10/19/2021 10:07 AM      Passed - TSH in normal range and within 360 days    TSH  Date Value Ref Range Status  03/31/2021 2.840 0.450 - 4.500 uIU/mL Final         Passed - Valid encounter within last 12 months    Recent Outpatient Visits          6 months ago Annual physical exam   [facility] Glean Hess, MD   2 years ago Annual physical exam   [facility] Glean Hess, MD   3 years ago Acquired hypothyroidism   [facility] Glean Hess, MD   3 years ago Annual physical exam   [facility] Glean Hess, MD   7 years ago Annual physical exam   [facility] Glean Hess, MD      Future Appointments            In 5 months Army Melia Jesse Sans, MD [facility], [facility]

## 2022-04-04 ENCOUNTER — Encounter: Payer: Self-pay | Admitting: Internal Medicine

## 2022-04-04 ENCOUNTER — Ambulatory Visit (INDEPENDENT_AMBULATORY_CARE_PROVIDER_SITE_OTHER): Payer: Commercial Managed Care - PPO | Admitting: Internal Medicine

## 2022-04-04 VITALS — BP 120/78 | HR 97 | Ht 62.0 in | Wt 148.0 lb

## 2022-04-04 DIAGNOSIS — Z1231 Encounter for screening mammogram for malignant neoplasm of breast: Secondary | ICD-10-CM | POA: Diagnosis not present

## 2022-04-04 DIAGNOSIS — E039 Hypothyroidism, unspecified: Secondary | ICD-10-CM

## 2022-04-04 DIAGNOSIS — Z0000 Encounter for general adult medical examination without abnormal findings: Secondary | ICD-10-CM

## 2022-04-04 DIAGNOSIS — E785 Hyperlipidemia, unspecified: Secondary | ICD-10-CM

## 2022-04-04 NOTE — Progress Notes (Signed)
Date:  04/04/2022   Name:  Meghan Stewart   DOB:  Apr 14, 1980   MRN:  [id number]   Chief Complaint: Annual Exam Meghan Stewart is a 42 y.o. female who presents today for her Complete Annual Exam. She feels well. She reports exercising daily doing yoga fo 1-1.5 hours. She reports she is sleeping well. Breast complaints - none.  Menses remain regular.  Mammogram: 06/2021 DEXA: none Pap smear: 03/2018 neg/neg Colonoscopy: none  There are no preventive care reminders to display for this patient.   Immunization History  Administered Date(s) Administered   Hepatitis B 10/12/2020, 02/27/2021   Hepatitis B, adult 11/11/2020   Influenza,inj,Quad PF,6+ Mos 01/15/2018   Influenza-Unspecified 01/30/2019, 01/10/2021   Tdap 10/12/2020   Varicella 10/12/2020, 11/11/2020    Thyroid Problem Presents for follow-up visit. Patient reports no anxiety, constipation, diarrhea, fatigue, palpitations or tremors. The symptoms have been stable.    Lab Results  Component Value Date   NA 138 03/31/2021   K 4.4 03/31/2021   CO2 22 03/31/2021   GLUCOSE 89 03/31/2021   BUN 10 03/31/2021   CREATININE 0.72 03/31/2021   CALCIUM 9.2 03/31/2021   EGFR 107 03/31/2021   GFRNONAA 110 04/10/2019   Lab Results  Component Value Date   CHOL 197 03/31/2021   HDL 38 (L) 03/31/2021   LDLCALC 139 (H) 03/31/2021   TRIG 107 03/31/2021   CHOLHDL 5.2 (H) 03/31/2021   Lab Results  Component Value Date   TSH 2.840 03/31/2021   No results found for: "HGBA1C" Lab Results  Component Value Date   WBC 6.7 03/31/2021   HGB 14.2 03/31/2021   HCT 41.7 03/31/2021   MCV 90 03/31/2021   PLT 182 03/31/2021   Lab Results  Component Value Date   ALT 9 03/31/2021   AST 13 03/31/2021   ALKPHOS 66 03/31/2021   BILITOT 0.5 03/31/2021   Lab Results  Component Value Date   VD25OH 48.3 03/31/2021     Review of Systems  Constitutional:  Negative for chills, fatigue and fever.  HENT:  Negative for congestion, hearing loss,  tinnitus, trouble swallowing and voice change.   Eyes:  Negative for visual disturbance.  Respiratory:  Negative for cough, chest tightness, shortness of breath and wheezing.   Cardiovascular:  Negative for chest pain, palpitations and leg swelling.  Gastrointestinal:  Negative for abdominal pain, constipation, diarrhea and vomiting.  Endocrine: Negative for polydipsia and polyuria.  Genitourinary:  Negative for dysuria, frequency, genital sores, vaginal bleeding and vaginal discharge.  Musculoskeletal:  Negative for arthralgias, gait problem and joint swelling.  Skin:  Negative for color change and rash.  Neurological:  Negative for dizziness, tremors, light-headedness and headaches.  Hematological:  Negative for adenopathy. Does not bruise/bleed easily.  Psychiatric/Behavioral:  Negative for dysphoric mood and sleep disturbance. The patient is not nervous/anxious.     Patient Active Problem List   Diagnosis Date Noted   Mild hyperlipidemia 04/02/2021   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   Slow transit constipation 04/08/2018   Plantar fasciitis of left foot 04/08/2018   Acquired hypothyroidism 07/02/2014   Neoplasm of uncertain behavior of skin 07/02/2014   Avitaminosis D 02/11/2014    No Known Allergies  Past Surgical History:  Procedure Laterality Date   CESAREAN SECTION     x 2   ORTHOPEDIC SURGERY  1980's   to remove extra digit at base of left thumb   TUBAL LIGATION     tummy tuck  2017  Social History   Tobacco Use   Smoking status: Never   Smokeless tobacco: Never  Vaping Use   Vaping Use: Never used  Substance Use Topics   Alcohol use: Yes    Alcohol/week: 0.0 standard drinks of alcohol    Comment: rarely    Drug use: No     Medication list has been reviewed and updated.  Current Meds  Medication Sig   Calcium Carb-Cholecalciferol (CALCIUM + D3) 600-200 MG-UNIT TABS Take 1 tablet by mouth daily.   Cholecalciferol (EQL VITAMIN D3) 25 MCG (1000 UT)  capsule Take 1,000 Units by mouth daily.   Fish Oil-Cholecalciferol (FISH OIL + D3) 1000-1000 MG-UNIT CAPS Take 1 capsule by mouth daily.   levothyroxine (SYNTHROID) 75 MCG tablet Take 1 tablet by mouth once daily   Multiple Vitamins-Minerals (MULTIVITAMIN WITH MINERALS) tablet Take 1 tablet by mouth daily.       04/04/2022    8:23 AM 03/31/2021    8:46 AM  GAD 7 : Generalized Anxiety Score  Nervous, Anxious, on Edge 0 0  Control/stop worrying 0 1  Worry too much - different things 0 0  Trouble relaxing 0 0  Restless 0 0  Easily annoyed or irritable 0 0  Afraid - awful might happen 0 0  Total GAD 7 Score 0 1  Anxiety Difficulty Not difficult at all Not difficult at all       04/04/2022    8:23 AM 03/31/2021    8:46 AM 04/10/2019   10:36 AM  Depression screen PHQ 2/9  Decreased Interest 0 0 0  Down, Depressed, Hopeless 0 0 0  PHQ - 2 Score 0 0 0  Altered sleeping 0 0   Tired, decreased energy 0 0   Change in appetite 0 0   Feeling bad or failure about yourself  0 0   Trouble concentrating 0 0   Moving slowly or fidgety/restless 0 0   Suicidal thoughts 0 0   PHQ-9 Score 0 0   Difficult doing work/chores Not difficult at all Not difficult at all     BP Readings from Last 3 Encounters:  04/04/22 120/78  03/31/21 96/68  04/12/20 118/74    Physical Exam Vitals and nursing note reviewed.  Constitutional:      General: She is not in acute distress.    Appearance: She is well-developed.  HENT:     Head: Normocephalic and atraumatic.     Right Ear: Tympanic membrane and ear canal normal.     Left Ear: Tympanic membrane and ear canal normal.     Nose:     Right Sinus: No maxillary sinus tenderness.     Left Sinus: No maxillary sinus tenderness.  Eyes:     General: No scleral icterus.       Right eye: No discharge.        Left eye: No discharge.     Conjunctiva/sclera: Conjunctivae normal.  Neck:     Thyroid: No thyromegaly.     Vascular: No carotid bruit.   Cardiovascular:     Rate and Rhythm: Normal rate and regular rhythm.     Pulses: Normal pulses.     Heart sounds: Normal heart sounds.  Pulmonary:     Effort: Pulmonary effort is normal. No respiratory distress.     Breath sounds: No wheezing.  Chest:  Breasts:    Right: No mass, nipple discharge, skin change or tenderness.     Left: No mass, nipple discharge, skin change  or tenderness.  Abdominal:     General: Bowel sounds are normal.     Palpations: Abdomen is soft.     Tenderness: There is no abdominal tenderness.  Musculoskeletal:     Cervical back: Normal range of motion. No erythema.     Right lower leg: No edema.     Left lower leg: No edema.  Lymphadenopathy:     Cervical: No cervical adenopathy.  Skin:    General: Skin is warm and dry.     Findings: No rash.  Neurological:     Mental Status: She is alert and oriented to person, place, and time.     Cranial Nerves: No cranial nerve deficit.     Sensory: No sensory deficit.     Deep Tendon Reflexes: Reflexes are normal and symmetric.  Psychiatric:        Attention and Perception: Attention normal.        Mood and Affect: Mood normal.     Wt Readings from Last 3 Encounters:  04/04/22 148 lb (67.1 kg)  03/31/21 139 lb (63 kg)  04/12/20 145 lb (65.8 kg)    BP 120/78   Pulse 97   Ht '5\' 2"'$  (1.575 m)   Wt 148 lb (67.1 kg)   LMP 03/22/2022 (Exact Date)   SpO2 99%   BMI 27.07 kg/m   Assessment and Plan: Problem List Items Addressed This Visit       Endocrine   Acquired hypothyroidism    Supplemented without symptoms to suggest poor control Last TSH was normal one year ago      Relevant Orders   TSH + free T4     Other   Mild hyperlipidemia    Borderline elevated LDL and low HDL No medication recommended - continue low fat diet      Relevant Orders   Lipid panel   Other Visit Diagnoses     Annual physical exam    -  Primary   Normal exam continue exercise and healthy diet declines flu  vaccine today   Relevant Orders   CBC with Differential/Platelet   Comprehensive metabolic panel   Lipid panel   TSH + free T4   Encounter for screening mammogram for breast cancer       Relevant Orders   MM 3D SCREEN BREAST BILATERAL        Partially dictated using Editor, commissioning. Any errors are unintentional.  Halina Maidens, MD [facility]  04/04/2022

## 2022-04-04 NOTE — Assessment & Plan Note (Signed)
Supplemented without symptoms to suggest poor control Last TSH was normal one year ago

## 2022-04-04 NOTE — Patient Instructions (Signed)
Call [facility] to schedule your mammogram at [phone].  

## 2022-04-04 NOTE — Assessment & Plan Note (Signed)
Borderline elevated LDL and low HDL No medication recommended - continue low fat diet

## 2022-04-05 LAB — COMPREHENSIVE METABOLIC PANEL
ALT: 13 IU/L (ref 0–32)
AST: 17 IU/L (ref 0–40)
Albumin/Globulin Ratio: 1.7 (ref 1.2–2.2)
Albumin: 4.5 g/dL (ref 3.9–4.9)
Alkaline Phosphatase: 62 IU/L (ref 44–121)
BUN/Creatinine Ratio: 14 (ref 9–23)
BUN: 10 mg/dL (ref 6–24)
Bilirubin Total: 0.4 mg/dL (ref 0.0–1.2)
CO2: 21 mmol/L (ref 20–29)
Calcium: 9 mg/dL (ref 8.7–10.2)
Chloride: 103 mmol/L (ref 96–106)
Creatinine, Ser: 0.69 mg/dL (ref 0.57–1.00)
Globulin, Total: 2.6 g/dL (ref 1.5–4.5)
Glucose: 88 mg/dL (ref 70–99)
Potassium: 4.3 mmol/L (ref 3.5–5.2)
Sodium: 139 mmol/L (ref 134–144)
Total Protein: 7.1 g/dL (ref 6.0–8.5)
eGFR: 110 mL/min/{1.73_m2} (ref >59)

## 2022-04-05 LAB — CBC WITH DIFFERENTIAL/PLATELET
Basophils Absolute: 0 10*3/uL (ref 0.0–0.2)
Basos: 0 %
EOS (ABSOLUTE): 0.1 10*3/uL (ref 0.0–0.4)
Eos: 1 %
Hematocrit: 43.1 % (ref 34.0–46.6)
Hemoglobin: 14.2 g/dL (ref 11.1–15.9)
Immature Grans (Abs): 0 10*3/uL (ref 0.0–0.1)
Immature Granulocytes: 0 %
Lymphocytes Absolute: 1.5 10*3/uL (ref 0.7–3.1)
Lymphs: 27 %
MCH: 30 pg (ref 26.6–33.0)
MCHC: 32.9 g/dL (ref 31.5–35.7)
MCV: 91 fL (ref 79–97)
Monocytes Absolute: 0.3 10*3/uL (ref 0.1–0.9)
Monocytes: 5 %
Neutrophils Absolute: 3.7 10*3/uL (ref 1.4–7.0)
Neutrophils: 67 %
Platelets: 174 10*3/uL (ref 150–450)
RBC: 4.74 x10E6/uL (ref 3.77–5.28)
RDW: 12.9 % (ref 11.7–15.4)
WBC: 5.6 10*3/uL (ref 3.4–10.8)

## 2022-04-05 LAB — LIPID PANEL
Chol/HDL Ratio: 5.1 ratio — ABNORMAL HIGH (ref 0.0–4.4)
Cholesterol, Total: 213 mg/dL — ABNORMAL HIGH (ref 100–199)
HDL: 42 mg/dL (ref >39)
LDL Chol Calc (NIH): 156 mg/dL — ABNORMAL HIGH (ref 0–99)
Triglycerides: 85 mg/dL (ref 0–149)
VLDL Cholesterol Cal: 15 mg/dL (ref 5–40)

## 2022-04-05 LAB — TSH+FREE T4
Free T4: 1.26 ng/dL (ref 0.82–1.77)
TSH: 4.06 u[IU]/mL (ref 0.450–4.500)

## 2022-04-29 ENCOUNTER — Other Ambulatory Visit: Payer: Self-pay | Admitting: Internal Medicine

## 2022-04-29 DIAGNOSIS — E039 Hypothyroidism, unspecified: Secondary | ICD-10-CM

## 2022-04-30 ENCOUNTER — Telehealth: Payer: Self-pay | Admitting: Internal Medicine

## 2022-04-30 NOTE — Telephone Encounter (Signed)
Copied from [place] [id number]. Topic: General - Other >> Apr 30, 2022  4:43 PM Everette C wrote: Reason for CRM: Medication Refill - Medication: levothyroxine (SYNTHROID) 75 MCG tablet [id number]  Has the patient contacted their pharmacy? Yes.   (Agent: If no, request that the patient contact the pharmacy for the refill. If patient does not wish to contact the pharmacy document the reason why and proceed with request.) (Agent: If yes, when and what did the pharmacy advise?)  Preferred Pharmacy (with phone number or street name): [place], [place] - [address] [place] [address] Phone: [phone] Fax: [phone] Hours: Not open 24 hours   Has the patient been seen for an appointment in the last year OR does the patient have an upcoming appointment? Yes.    Agent: Please be advised that RX refills may take up to 3 business days. We ask that you follow-up with your pharmacy.

## 2022-04-30 NOTE — Telephone Encounter (Signed)
Requested by pharmacy on 04/29/22, still pending approval.

## 2022-05-01 NOTE — Telephone Encounter (Signed)
Requested Prescriptions  Pending Prescriptions Disp Refills   levothyroxine (SYNTHROID) 75 MCG tablet [Pharmacy Med Name: Levothyroxine Sodium 75 MCG Oral Tablet] 90 tablet 0    Sig: Take 1 tablet by mouth once daily     Endocrinology:  Hypothyroid Agents Passed - 04/29/2022  3:48 PM      Passed - TSH in normal range and within 360 days    TSH  Date Value Ref Range Status  04/04/2022 4.060 0.450 - 4.500 uIU/mL Final         Passed - Valid encounter within last 12 months    Recent Outpatient Visits           3 weeks ago Annual physical exam   [place] at [facility], Jesse Sans, MD   1 year ago Annual physical exam   [place] at [facility], Jesse Sans, MD   3 years ago Annual physical exam   [place] at [facility], Jesse Sans, MD   3 years ago Acquired hypothyroidism   [facility], Jesse Sans, MD   4 years ago Annual physical exam   [place] at [facility], Jesse Sans, MD

## 2022-08-02 ENCOUNTER — Other Ambulatory Visit: Payer: Self-pay | Admitting: Internal Medicine

## 2022-08-02 DIAGNOSIS — E039 Hypothyroidism, unspecified: Secondary | ICD-10-CM

## 2022-08-02 NOTE — Telephone Encounter (Signed)
Requested Prescriptions  Pending Prescriptions Disp Refills   levothyroxine (SYNTHROID) 75 MCG tablet [Pharmacy Med Name: Levothyroxine Sodium 75 MCG Oral Tablet] 90 tablet 0    Sig: Take 1 tablet by mouth once daily     Endocrinology:  Hypothyroid Agents Passed - 08/02/2022  4:25 PM      Passed - TSH in normal range and within 360 days    TSH  Date Value Ref Range Status  04/04/2022 4.060 0.450 - 4.500 uIU/mL Final         Passed - Valid encounter within last 12 months    Recent Outpatient Visits           4 months ago Annual physical exam   [facility], Nyoka Cowden, MD   1 year ago Annual physical exam   [facility] at [facility], Nyoka Cowden, MD   3 years ago Annual physical exam   [facility] at [facility], Nyoka Cowden, MD   3 years ago Acquired hypothyroidism   [facility] at [facility], Nyoka Cowden, MD   4 years ago Annual physical exam   [facility] at [facility], Nyoka Cowden, MD

## 2022-10-02 ENCOUNTER — Ambulatory Visit
Admission: RE | Admit: 2022-10-02 | Discharge: 2022-10-02 | Disposition: A | Discharge status: Home / Self Care | Payer: Commercial Managed Care - PPO | Source: Ambulatory Visit | Attending: Internal Medicine | Admitting: Internal Medicine

## 2022-10-02 DIAGNOSIS — Z1231 Encounter for screening mammogram for malignant neoplasm of breast: Secondary | ICD-10-CM | POA: Insufficient documentation

## 2023-01-02 ENCOUNTER — Other Ambulatory Visit: Payer: Self-pay | Admitting: Internal Medicine

## 2023-01-02 DIAGNOSIS — E039 Hypothyroidism, unspecified: Secondary | ICD-10-CM

## 2023-05-23 ENCOUNTER — Ambulatory Visit: Payer: Self-pay | Admitting: Internal Medicine

## 2023-05-23 NOTE — Telephone Encounter (Signed)
 Lower abdominal pain started today Frequency: Intermittent Pertinent Negatives: Patient denies pain radiation, back pain, nausea, fever, pain with urination  Disposition: [x] Appointment(In office/)  Additional Notes: Spoke with pt's husband, Surender, and pt on phone. Pt states her last period started on 2/21 and since then she has had intermittent dark brown bleeding. Pt states it is not a lot of blood. Pt states she has intermittent lower abdominal pain that started today. Pt states it is 5/10 and comes and goes. Pt scheduled for first available appointment with PCP tomorrow morning. This RN educated pt on home care, new-worsening symptoms, when to call back/seek emergent care. Pt verbalized understanding and agrees to plan.    Copied from CRM [id number]. Topic: Clinical - Red Word Triage >> May 23, 2023 11:53 AM Ivette P wrote: [facility] that prompted transfer to Nurse Triage: stomach pain, abdominal pain, menopause symptoms Reason for Disposition  [1] MODERATE pain (e.g., interferes with normal activities) AND [2] pain comes and goes (cramps) AND [3] present > 24 hours  (Exception: Pain with Vomiting or Diarrhea - see that Guideline.)  Answer Assessment - Initial Assessment Questions 1. LOCATION: "Where does it hurt?"      Abdomen 2. RADIATION: "Does the pain shoot anywhere else?" (e.g., chest, back)     No 3. ONSET: "When did the pain begin?" (e.g., minutes, hours or days ago)      Today 4. PATTERN "Does the pain come and go, or is it constant?"    - If it comes and goes: "How long does it last?" "Do you have pain now?"     (Note: Comes and goes means the pain is intermittent. It goes away completely between bouts.)    - If constant: "Is it getting better, staying the same, or getting worse?"      (Note: Constant means the pain never goes away completely; most serious pain is constant and gets worse.)      Intermittent 5. SEVERITY: "How bad is the pain?"  (e.g., Scale 1-10; mild,  moderate, or severe)    - MILD (1-3): Doesn't interfere with normal activities, abdomen soft and not tender to touch.     - MODERATE (4-7): Interferes with normal activities or awakens from sleep, abdomen tender to touch.     - SEVERE (8-10): Excruciating pain, doubled over, unable to do any normal activities.       5 6. RECURRENT SYMPTOM: "Have you ever had this type of stomach pain before?" If Yes, ask: "When was the last time?" and "What happened that time?"      Denies 7. OTHER SYMPTOMS: "Do you have any other symptoms?" (e.g., back pain, diarrhea, fever, urination pain, vomiting)       Denies  Protocols used: Abdominal Pain - Female-A-AH

## 2023-05-24 ENCOUNTER — Ambulatory Visit (INDEPENDENT_AMBULATORY_CARE_PROVIDER_SITE_OTHER): Admitting: Internal Medicine

## 2023-05-24 ENCOUNTER — Encounter: Payer: Self-pay | Admitting: Internal Medicine

## 2023-05-24 VITALS — BP 116/74 | HR 97 | Temp 98.2°F | Ht 62.0 in | Wt 150.4 lb

## 2023-05-24 DIAGNOSIS — N926 Irregular menstruation, unspecified: Secondary | ICD-10-CM

## 2023-05-24 NOTE — Progress Notes (Signed)
 Date:  05/24/2023   Name:  Meghan Stewart   DOB:  21-Sep-1980   MRN:  [id number]   Chief Complaint: Abdominal Pain (Patient said she has lower abdominal pain, it comes and goes)  Vaginal Bleeding The patient's primary symptoms include vaginal bleeding. This is a new problem. The current episode started 1 to 4 weeks ago. The problem occurs intermittently. The pain is mild (yesterday only). She is not pregnant. Pertinent negatives include no chills, fever or headaches.  She had normal menses one month ago lasting 3 days.  One week later had menstrual bleeding for 2 days (dark red, no clots, no pain) then this past Sunday bleeding again with some mild cramping yesterday only. She is not on OCPs, BTL, no concern for pregnancy.  No history of fibroids. She exercises regularly, no change in supplements, no new medications. Mother had onset of menopause around age 15.   Review of Systems  Constitutional:  Negative for chills, fatigue and fever.  Respiratory:  Negative for chest tightness and shortness of breath.   Cardiovascular:  Negative for chest pain, palpitations and leg swelling.  Genitourinary:  Positive for menstrual problem.  Neurological:  Negative for dizziness and headaches.     Lab Results  Component Value Date   NA 139 04/04/2022   K 4.3 04/04/2022   CO2 21 04/04/2022   GLUCOSE 88 04/04/2022   BUN 10 04/04/2022   CREATININE 0.69 04/04/2022   CALCIUM 9.0 04/04/2022   EGFR 110 04/04/2022   GFRNONAA 110 04/10/2019   Lab Results  Component Value Date   CHOL 213 (H) 04/04/2022   HDL 42 04/04/2022   LDLCALC 156 (H) 04/04/2022   TRIG 85 04/04/2022   CHOLHDL 5.1 (H) 04/04/2022   Lab Results  Component Value Date   TSH 4.060 04/04/2022   No results found for: "HGBA1C" Lab Results  Component Value Date   WBC 5.6 04/04/2022   HGB 14.2 04/04/2022   HCT 43.1 04/04/2022   MCV 91 04/04/2022   PLT 174 04/04/2022   Lab Results  Component Value Date   ALT 13 04/04/2022    AST 17 04/04/2022   ALKPHOS 62 04/04/2022   BILITOT 0.4 04/04/2022   Lab Results  Component Value Date   VD25OH 48.3 03/31/2021     Patient Active Problem List   Diagnosis Date Noted   Mild hyperlipidemia 04/02/2021   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   Slow transit constipation 04/08/2018   Plantar fasciitis of left foot 04/08/2018   Acquired hypothyroidism 07/02/2014   Neoplasm of uncertain behavior of skin 07/02/2014   Avitaminosis D 02/11/2014    No Known Allergies  Past Surgical History:  Procedure Laterality Date   CESAREAN SECTION     x 2   ORTHOPEDIC SURGERY  1980's   to remove extra digit at base of left thumb   TUBAL LIGATION     tummy tuck  2017    Social History   Tobacco Use   Smoking status: Never   Smokeless tobacco: Never  Vaping Use   Vaping status: Never Used  Substance Use Topics   Alcohol use: Yes    Alcohol/week: 0.0 standard drinks of alcohol    Comment: rarely    Drug use: No     Medication list has been reviewed and updated.  Current Meds  Medication Sig   Calcium Carb-Cholecalciferol (CALCIUM + D3) 600-200 MG-UNIT TABS Take 1 tablet by mouth daily.   Cholecalciferol (EQL VITAMIN D3) 25 MCG (  1000 UT) capsule Take 1,000 Units by mouth daily.   Fish Oil-Cholecalciferol (FISH OIL + D3) 1000-1000 MG-UNIT CAPS Take 1 capsule by mouth daily.   levothyroxine (SYNTHROID) 75 MCG tablet Take 1 tablet by mouth once daily   Multiple Vitamins-Minerals (MULTIVITAMIN WITH MINERALS) tablet Take 1 tablet by mouth daily.       05/24/2023   11:29 AM 04/04/2022    8:23 AM 03/31/2021    8:46 AM  GAD 7 : Generalized Anxiety Score  Nervous, Anxious, on Edge 0 0 0  Control/stop worrying 0 0 1  Worry too much - different things 1 0 0  Trouble relaxing 0 0 0  Restless 0 0 0  Easily annoyed or irritable 0 0 0  Afraid - awful might happen 0 0 0  Total GAD 7 Score 1 0 1  Anxiety Difficulty Not difficult at all Not difficult at all Not difficult at  all       05/24/2023   11:29 AM 04/04/2022    8:23 AM 03/31/2021    8:46 AM  Depression screen PHQ 2/9  Decreased Interest 0 0 0  Down, Depressed, Hopeless 0 0 0  PHQ - 2 Score 0 0 0  Altered sleeping 0 0 0  Tired, decreased energy 0 0 0  Change in appetite 0 0 0  Feeling bad or failure about yourself  0 0 0  Trouble concentrating 0 0 0  Moving slowly or fidgety/restless 0 0 0  Suicidal thoughts  0 0  PHQ-9 Score 0 0 0  Difficult doing work/chores Not difficult at all Not difficult at all Not difficult at all    BP Readings from Last 3 Encounters:  05/24/23 116/74  04/04/22 120/78  03/31/21 96/68    Physical Exam Constitutional:      Appearance: Normal appearance. She is well-developed.  Cardiovascular:     Rate and Rhythm: Normal rate and regular rhythm.     Pulses: Normal pulses.  Pulmonary:     Effort: Pulmonary effort is normal.     Breath sounds: No wheezing or rhonchi.  Abdominal:     General: Abdomen is flat. Bowel sounds are normal.     Palpations: Abdomen is soft.     Tenderness: There is no abdominal tenderness.  Musculoskeletal:     Right lower leg: No edema.     Left lower leg: No edema.  Lymphadenopathy:     Cervical: No cervical adenopathy.  Skin:    General: Skin is warm and dry.  Neurological:     General: No focal deficit present.     Mental Status: She is alert.  Psychiatric:        Mood and Affect: Mood normal.        Behavior: Behavior normal.     Wt Readings from Last 3 Encounters:  05/24/23 150 lb 6 oz (68.2 kg)  04/04/22 148 lb (67.1 kg)  03/31/21 139 lb (63 kg)    BP 116/74   Pulse 97   Temp 98.2 F (36.8 C)   Ht 5\' 2"  (1.575 m)   Wt 150 lb 6 oz (68.2 kg)   LMP 05/19/2023   SpO2 99%   BMI 27.50 kg/m   Assessment and Plan:  Problem List Items Addressed This Visit   None Visit Diagnoses       Irregular menstrual bleeding    -  Primary   likely peri-menopausal in etiology will check labs.  Monitor for  worsening/recurrence. consider Pelvic US  Relevant Orders   CBC with Differential/Platelet   TSH + free T4       No follow-ups on file.    Reubin Milan, MD [facility] [place]

## 2023-05-25 LAB — CBC WITH DIFFERENTIAL/PLATELET
Basophils Absolute: 0 10*3/uL (ref 0.0–0.2)
Basos: 1 %
EOS (ABSOLUTE): 0.1 10*3/uL (ref 0.0–0.4)
Eos: 1 %
Hematocrit: 40.7 % (ref 34.0–46.6)
Hemoglobin: 13.7 g/dL (ref 11.1–15.9)
Immature Grans (Abs): 0 10*3/uL (ref 0.0–0.1)
Immature Granulocytes: 0 %
Lymphocytes Absolute: 1.9 10*3/uL (ref 0.7–3.1)
Lymphs: 25 %
MCH: 30.8 pg (ref 26.6–33.0)
MCHC: 33.7 g/dL (ref 31.5–35.7)
MCV: 92 fL (ref 79–97)
Monocytes Absolute: 0.5 10*3/uL (ref 0.1–0.9)
Monocytes: 7 %
Neutrophils Absolute: 5 10*3/uL (ref 1.4–7.0)
Neutrophils: 66 %
Platelets: 171 10*3/uL (ref 150–450)
RBC: 4.45 x10E6/uL (ref 3.77–5.28)
RDW: 12.8 % (ref 11.7–15.4)
WBC: 7.5 10*3/uL (ref 3.4–10.8)

## 2023-05-25 LAB — TSH+FREE T4
Free T4: 1.29 ng/dL (ref 0.82–1.77)
TSH: 5.94 u[IU]/mL — ABNORMAL HIGH (ref 0.450–4.500)

## 2023-05-26 ENCOUNTER — Encounter: Payer: Self-pay | Admitting: Internal Medicine

## 2023-06-26 ENCOUNTER — Other Ambulatory Visit (HOSPITAL_COMMUNITY)
Admission: RE | Admit: 2023-06-26 | Discharge: 2023-06-26 | Disposition: A | Discharge status: Home / Self Care | Source: Ambulatory Visit | Attending: Internal Medicine | Admitting: Internal Medicine

## 2023-06-26 ENCOUNTER — Encounter: Payer: Self-pay | Admitting: Internal Medicine

## 2023-06-26 ENCOUNTER — Ambulatory Visit (INDEPENDENT_AMBULATORY_CARE_PROVIDER_SITE_OTHER): Payer: Self-pay | Admitting: Internal Medicine

## 2023-06-26 VITALS — BP 112/76 | HR 96 | Ht 62.0 in | Wt 145.5 lb

## 2023-06-26 DIAGNOSIS — Z124 Encounter for screening for malignant neoplasm of cervix: Secondary | ICD-10-CM | POA: Diagnosis present

## 2023-06-26 DIAGNOSIS — Z131 Encounter for screening for diabetes mellitus: Secondary | ICD-10-CM

## 2023-06-26 DIAGNOSIS — Z0000 Encounter for general adult medical examination without abnormal findings: Secondary | ICD-10-CM

## 2023-06-26 DIAGNOSIS — E039 Hypothyroidism, unspecified: Secondary | ICD-10-CM

## 2023-06-26 DIAGNOSIS — Z1231 Encounter for screening mammogram for malignant neoplasm of breast: Secondary | ICD-10-CM | POA: Diagnosis not present

## 2023-06-26 DIAGNOSIS — Z1211 Encounter for screening for malignant neoplasm of colon: Secondary | ICD-10-CM | POA: Diagnosis not present

## 2023-06-26 DIAGNOSIS — E785 Hyperlipidemia, unspecified: Secondary | ICD-10-CM

## 2023-06-26 MED ORDER — LEVOTHYROXINE SODIUM 75 MCG PO TABS: 75.0000 ug | ORAL_TABLET | Freq: Every day | ORAL | 1 refills | Status: DC

## 2023-06-26 NOTE — Patient Instructions (Signed)
 Call [facility] to schedule your mammogram at [phone] - due in July.

## 2023-06-26 NOTE — Assessment & Plan Note (Addendum)
 Supplemented.  Labs done last month were normal.

## 2023-06-26 NOTE — Progress Notes (Signed)
 Date:  06/26/2023   Name:  Meghan Stewart   DOB:  [birth year]   MRN:  [id number]   Chief Complaint: Annual Exam Meghan Stewart is a 43 y.o. female who presents today for her Complete Annual Exam. She feels well. She reports exercising walk and does yoga, everyday for 1 hour or more . She reports she is sleeping well. Breast complaints none.  Since last month her menses have resumed - last on 06/14/23 and was normal.  Health Maintenance  Topic Date Due   Pap with HPV screening  04/09/2023   COVID-19 Vaccine (1) 07/12/2023*   Mammogram  10/02/2023   Flu Shot  10/11/2023   DTaP/Tdap/Td vaccine (2 - Td or Tdap) 10/13/2030   Hepatitis C Screening  Completed   HIV Screening  Completed   HPV Vaccine  Aged Out   Meningitis B Vaccine  Aged Out  *Topic was postponed. The date shown is not the original due date.     Thyroid Problem Presents for follow-up visit. Patient reports no constipation, diarrhea, fatigue or palpitations. The symptoms have been stable. Her past medical history is significant for hyperlipidemia.  Hyperlipidemia This is a chronic problem. Recent lipid tests were reviewed and are high. Pertinent negatives include no chest pain, myalgias or shortness of breath. Current antihyperlipidemic treatment includes diet change and exercise.    Review of Systems  Constitutional:  Negative for fatigue and unexpected weight change.  HENT:  Negative for trouble swallowing.   Eyes:  Negative for visual disturbance.  Respiratory:  Negative for cough, chest tightness, shortness of breath and wheezing.   Cardiovascular:  Negative for chest pain, palpitations and leg swelling.  Gastrointestinal:  Negative for abdominal pain, constipation and diarrhea.  Musculoskeletal:  Negative for arthralgias and myalgias.  Neurological:  Negative for dizziness, weakness, light-headedness and headaches.     Lab Results  Component Value Date   NA 139 04/04/2022   K 4.3 04/04/2022   CO2 21 04/04/2022    GLUCOSE 88 04/04/2022   BUN 10 04/04/2022   CREATININE 0.69 04/04/2022   CALCIUM 9.0 04/04/2022   EGFR 110 04/04/2022   GFRNONAA 110 04/10/2019   Lab Results  Component Value Date   CHOL 213 (H) 04/04/2022   HDL 42 04/04/2022   LDLCALC 156 (H) 04/04/2022   TRIG 85 04/04/2022   CHOLHDL 5.1 (H) 04/04/2022   Lab Results  Component Value Date   TSH 5.940 (H) 05/24/2023   No results found for: "HGBA1C" Lab Results  Component Value Date   WBC 7.5 05/24/2023   HGB 13.7 05/24/2023   HCT 40.7 05/24/2023   MCV 92 05/24/2023   PLT 171 05/24/2023   Lab Results  Component Value Date   ALT 13 04/04/2022   AST 17 04/04/2022   ALKPHOS 62 04/04/2022   BILITOT 0.4 04/04/2022   Lab Results  Component Value Date   VD25OH 48.3 03/31/2021     Patient Active Problem List   Diagnosis Date Noted   Mild hyperlipidemia 04/02/2021   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   Slow transit constipation 04/08/2018   Plantar fasciitis of left foot 04/08/2018   Acquired hypothyroidism 07/02/2014   Neoplasm of uncertain behavior of skin 07/02/2014   Avitaminosis D 02/11/2014    No Known Allergies  Past Surgical History:  Procedure Laterality Date   CESAREAN SECTION     x 2   ORTHOPEDIC SURGERY  1980's   to 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  Current Meds  Medication Sig   Calcium Carb-Cholecalciferol (CALCIUM + D3) 600-200 MG-UNIT TABS Take 1 tablet by mouth daily.   Cholecalciferol (EQL VITAMIN D3) 25 MCG (1000 UT) capsule Take 1,000 Units by mouth daily.   Fish Oil-Cholecalciferol (FISH OIL + D3) 1000-1000 MG-UNIT CAPS Take 1 capsule  by mouth daily.   Multiple Vitamins-Minerals (MULTIVITAMIN WITH MINERALS) tablet Take 1 tablet by mouth daily.   [DISCONTINUED] levothyroxine (SYNTHROID) 75 MCG tablet Take 1 tablet by mouth once daily       06/26/2023    8:16 AM 05/24/2023   11:29 AM 04/04/2022    8:23 AM 03/31/2021    8:46 AM  GAD 7 : Generalized Anxiety Score  Nervous, Anxious, on Edge 0 0 0 0  Control/stop worrying 0 0 0 1  Worry too much - different things 1 1 0 0  Trouble relaxing 0 0 0 0  Restless 0 0 0 0  Easily annoyed or irritable 0 0 0 0  Afraid - awful might happen 0 0 0 0  Total GAD 7 Score 1 1 0 1  Anxiety Difficulty Not difficult at all Not difficult at all Not difficult at all Not difficult at all       06/26/2023    8:16 AM 05/24/2023   11:29 AM 04/04/2022    8:23 AM  Depression screen PHQ 2/9  Decreased Interest 0 0 0  Down, Depressed, Hopeless 0 0 0  PHQ - 2 Score 0 0 0  Altered sleeping 0 0 0  Tired, decreased energy 0 0 0  Change in appetite 0 0 0  Feeling bad or failure about yourself  0 0 0  Trouble concentrating 0 0 0  Moving slowly or fidgety/restless 0 0 0  Suicidal thoughts 0  0  PHQ-9 Score 0 0 0  Difficult doing work/chores Not difficult at all Not difficult at all Not difficult at all    BP Readings from Last 3 Encounters:  06/26/23 112/76  05/24/23 116/74  04/04/22 120/78    Physical Exam Vitals and nursing note reviewed.  Constitutional:      General: She is not in acute distress.    Appearance: She is well-developed.  HENT:     Head: Normocephalic and atraumatic.     Right Ear: Tympanic membrane and ear canal normal.     Left Ear: Tympanic membrane and ear canal normal.     Nose:     Right Sinus: No maxillary sinus tenderness.     Left Sinus: No maxillary sinus tenderness.  Eyes:     General: No scleral icterus.       Right eye: No discharge.        Left eye: No discharge.     Conjunctiva/sclera: Conjunctivae normal.  Neck:     Thyroid: No thyromegaly.      Vascular: No carotid bruit.  Cardiovascular:     Rate and Rhythm: Normal rate and regular rhythm.     Pulses: Normal pulses.     Heart sounds:  Normal heart sounds.  Pulmonary:     Effort: Pulmonary effort is normal. No respiratory distress.     Breath sounds: No wheezing.  Abdominal:     General: Bowel sounds are normal.     Palpations: Abdomen is soft.     Tenderness: There is no abdominal tenderness.  Genitourinary:    Labia:        Right: No tenderness, lesion or injury.        Left: No tenderness, lesion or injury.      Vagina: Normal.     Cervix: Normal.     Uterus: Normal.      Adnexa: Right adnexa normal and left adnexa normal.     Comments: Pap obtained Musculoskeletal:     Cervical back: Normal range of motion. No erythema.     Right lower leg: No edema.     Left lower leg: No edema.  Lymphadenopathy:     Cervical: No cervical adenopathy.  Skin:    General: Skin is warm and dry.     Findings: No rash.  Neurological:     Mental Status: She is alert and oriented to person, place, and time.     Cranial Nerves: No cranial nerve deficit.     Sensory: No sensory deficit.     Deep Tendon Reflexes: Reflexes are normal and symmetric.  Psychiatric:        Attention and Perception: Attention normal.        Mood and Affect: Mood normal.     Wt Readings from Last 3 Encounters:  06/26/23 145 lb 8 oz (66 kg)  05/24/23 150 lb 6 oz (68.2 kg)  04/04/22 148 lb (67.1 kg)    BP 112/76   Pulse 96   Ht 5\' 2"  (1.575 m)   Wt 145 lb 8 oz (66 kg)   LMP 06/14/2023 (Exact Date)   SpO2 99%   BMI 26.61 kg/m   Assessment and Plan:  Problem List Items Addressed This Visit       Unprioritized   Acquired hypothyroidism   Supplemented.  Labs done last month were normal.      Relevant Medications   levothyroxine (SYNTHROID) 75 MCG tablet   Mild hyperlipidemia   Lipids managed with diet changes only. Lab Results  Component Value Date   LDLCALC 156 (H) 04/04/2022          Relevant Orders   Lipid panel   Other Visit Diagnoses       Annual physical exam    -  Primary   normal exam screenings and immunization are up to date   Relevant Orders   Cytology - PAP   Hemoglobin A1c   Lipid panel     Encounter for screening mammogram for breast cancer       Relevant Orders   MM 3D SCREENING MAMMOGRAM BILATERAL BREAST     Encounter for screening for cervical cancer       Relevant Orders   Cytology - PAP     Colon cancer screening       will discuss at 6 mo follow     Screening for diabetes mellitus       Relevant Orders   Hemoglobin A1c   Comprehensive metabolic panel with GFR       Return in about 6 months (around 12/26/2023) for thyroid.    Reubin Milan, MD [facility] [place]

## 2023-06-26 NOTE — Assessment & Plan Note (Signed)
 Lipids managed with diet changes only. Lab Results  Component Value Date   LDLCALC 156 (H) 04/04/2022

## 2023-06-27 ENCOUNTER — Encounter: Payer: Self-pay | Admitting: Internal Medicine

## 2023-06-27 LAB — COMPREHENSIVE METABOLIC PANEL WITH GFR
ALT: 10 IU/L (ref 0–32)
AST: 18 IU/L (ref 0–40)
Albumin: 4.4 g/dL (ref 3.9–4.9)
Alkaline Phosphatase: 61 IU/L (ref 44–121)
BUN/Creatinine Ratio: 15 (ref 9–23)
BUN: 9 mg/dL (ref 6–24)
Bilirubin Total: 0.4 mg/dL (ref 0.0–1.2)
CO2: 21 mmol/L (ref 20–29)
Calcium: 8.9 mg/dL (ref 8.7–10.2)
Chloride: 102 mmol/L (ref 96–106)
Creatinine, Ser: 0.62 mg/dL (ref 0.57–1.00)
Globulin, Total: 2.3 g/dL (ref 1.5–4.5)
Glucose: 92 mg/dL (ref 70–99)
Potassium: 4.2 mmol/L (ref 3.5–5.2)
Sodium: 137 mmol/L (ref 134–144)
Total Protein: 6.7 g/dL (ref 6.0–8.5)
eGFR: 113 mL/min/{1.73_m2} (ref >59)

## 2023-06-27 LAB — LIPID PANEL
Chol/HDL Ratio: 5.5 ratio — ABNORMAL HIGH (ref 0.0–4.4)
Cholesterol, Total: 247 mg/dL — ABNORMAL HIGH (ref 100–199)
HDL: 45 mg/dL (ref >39)
LDL Chol Calc (NIH): 184 mg/dL — ABNORMAL HIGH (ref 0–99)
Triglycerides: 101 mg/dL (ref 0–149)
VLDL Cholesterol Cal: 18 mg/dL (ref 5–40)

## 2023-06-27 LAB — HEMOGLOBIN A1C
Est. average glucose Bld gHb Est-mCnc: 111 mg/dL
Hgb A1c MFr Bld: 5.5 % (ref 4.8–5.6)

## 2023-06-28 LAB — CYTOLOGY - PAP
Adequacy: ABSENT
Comment: NEGATIVE
Diagnosis: NEGATIVE
Diagnosis: REACTIVE
High risk HPV: NEGATIVE

## 2023-09-03 IMAGING — MG MM DIGITAL SCREENING BILAT W/ TOMO AND CAD
8 series · 9 of 24 positions shown · non-contrast
Comparison: Previous exam(s).

CLINICAL DATA: Screening.

EXAM:
DIGITAL SCREENING BILATERAL MAMMOGRAM WITH TOMOSYNTHESIS AND CAD
TECHNIQUE: Bilateral screening digital craniocaudal and mediolateral oblique
mammograms were obtained. Bilateral screening digital breast
tomosynthesis was performed. The images were evaluated with
computer-aided detection.

[R CC synth-2D]
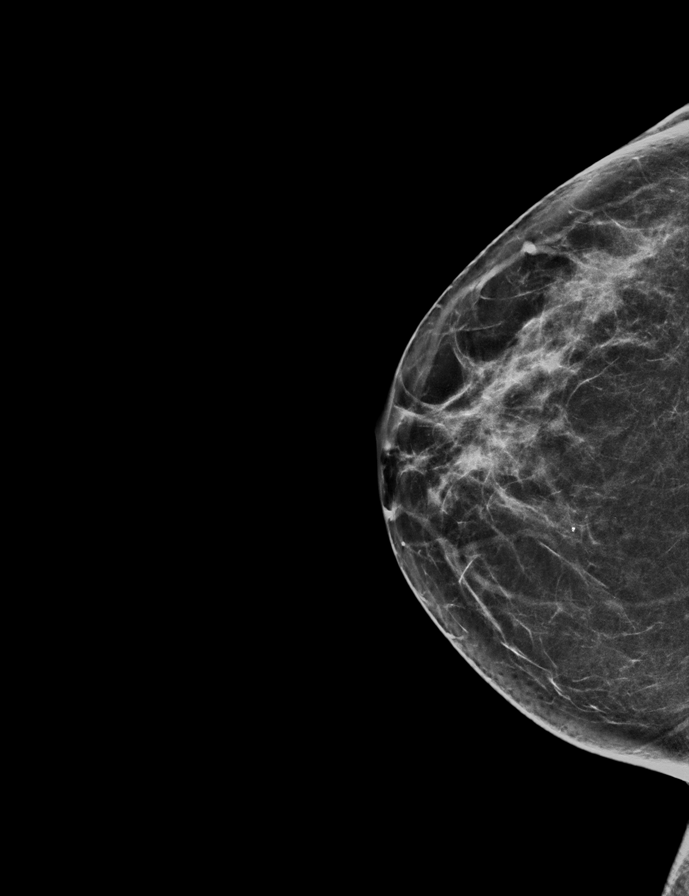

[L CC synth-2D]
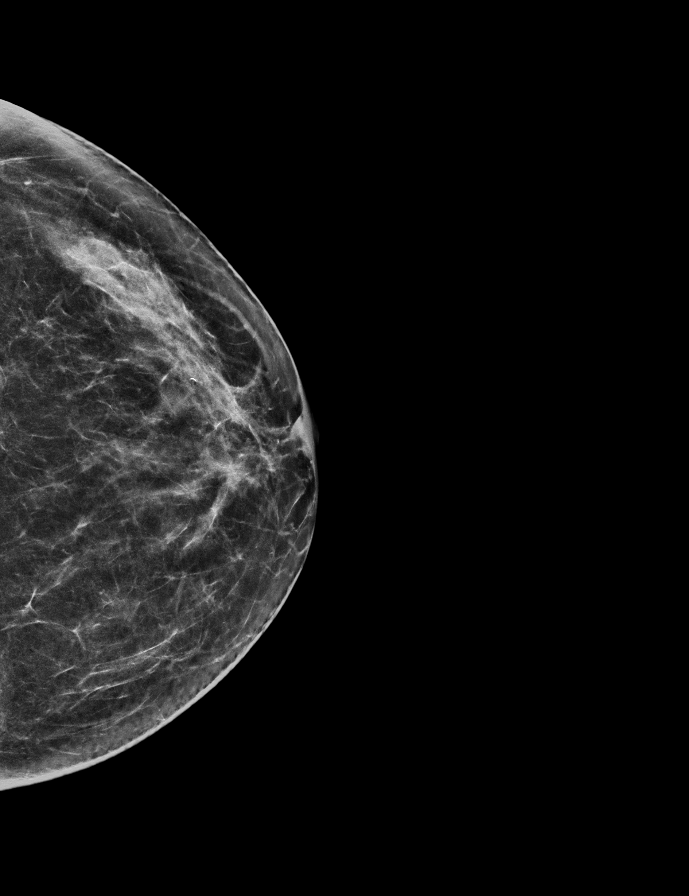

[L MLO synth-2D]
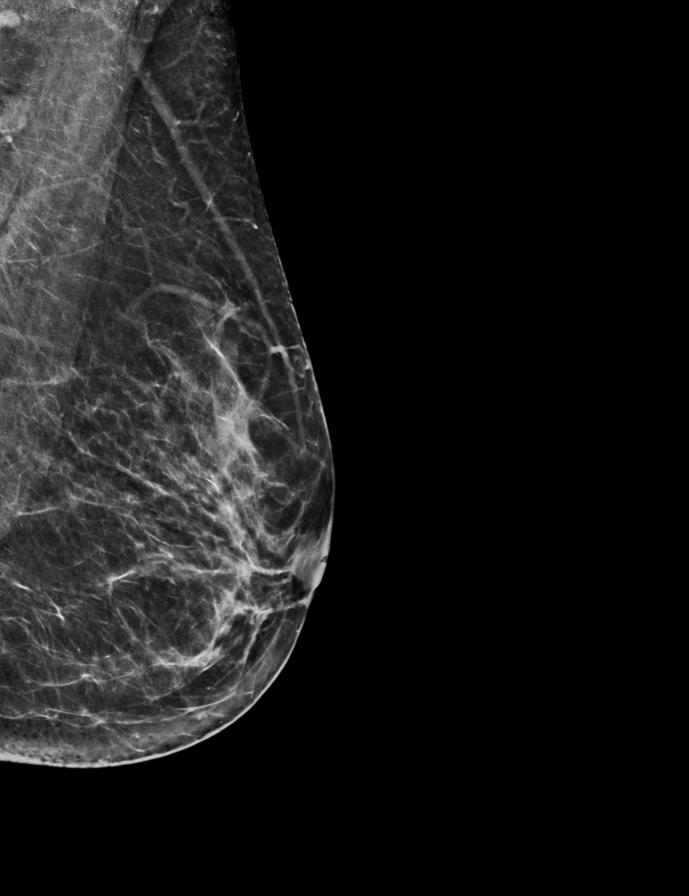

[R MLO synth-2D]
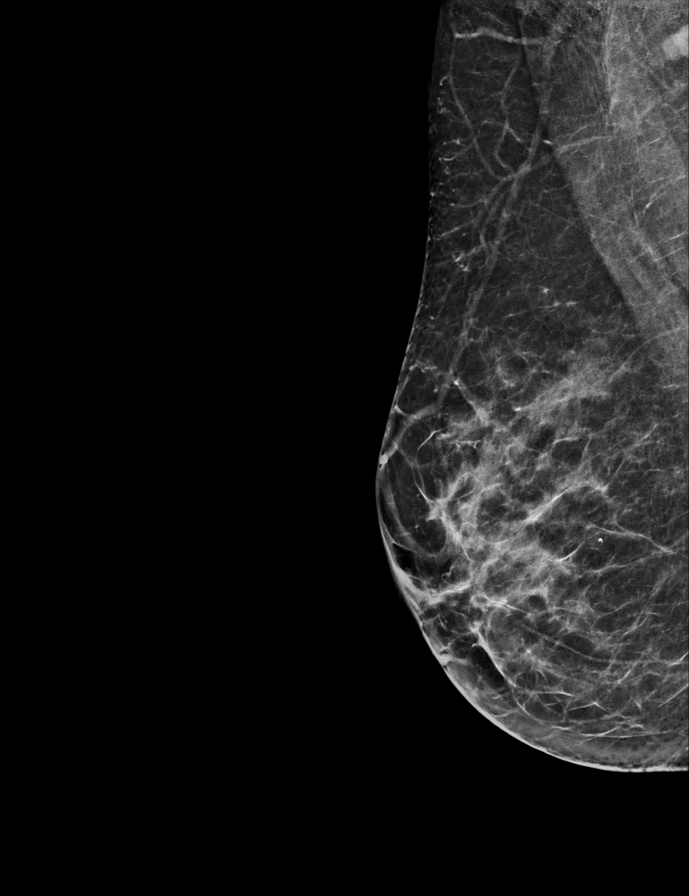

[R MLO tomo · 2 of 62 frames shown]
[frame 21/62]
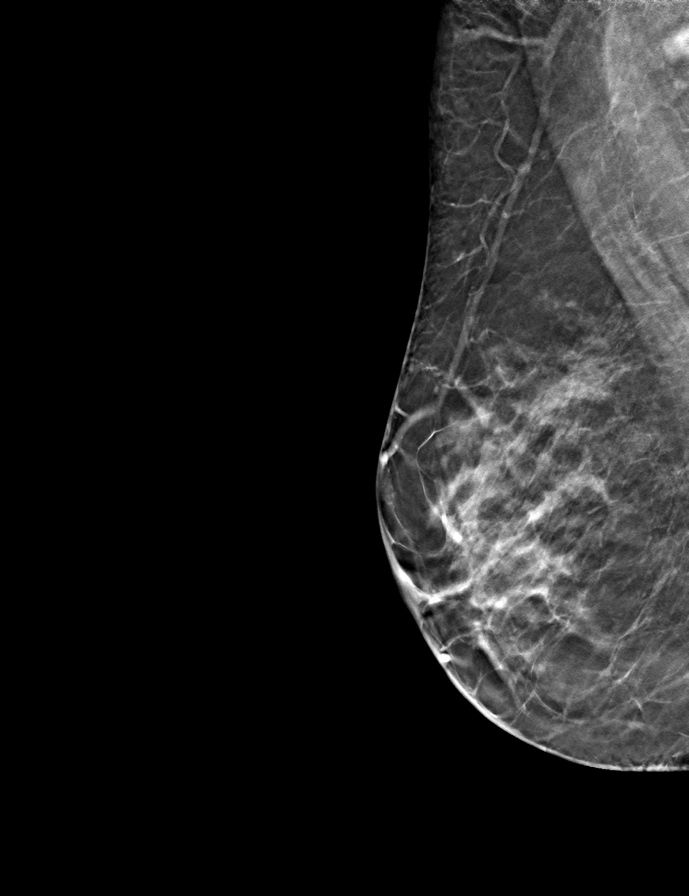
[frame 31/62]
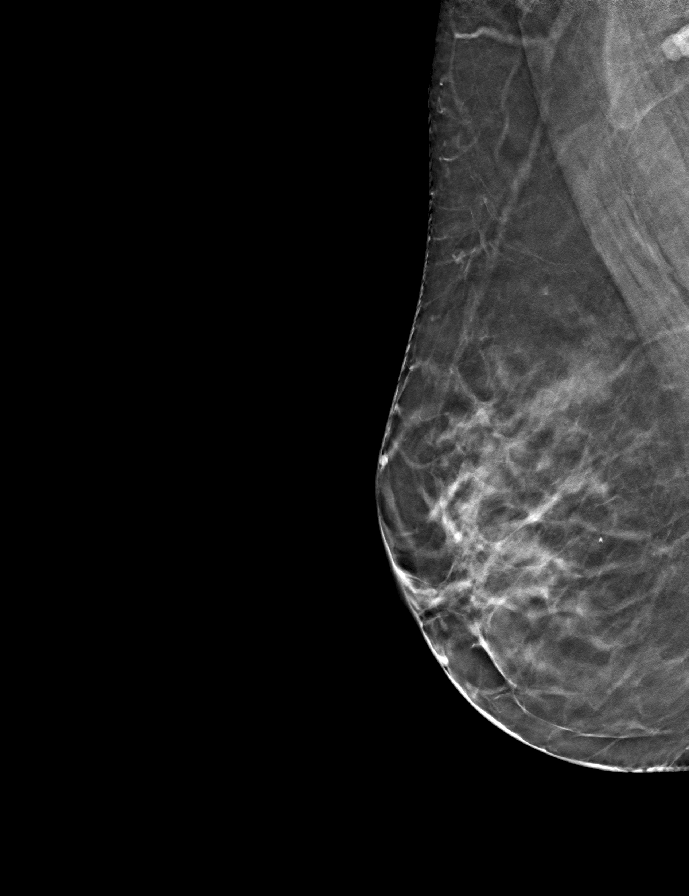

[L MLO tomo · tomo slice 33/64.0]
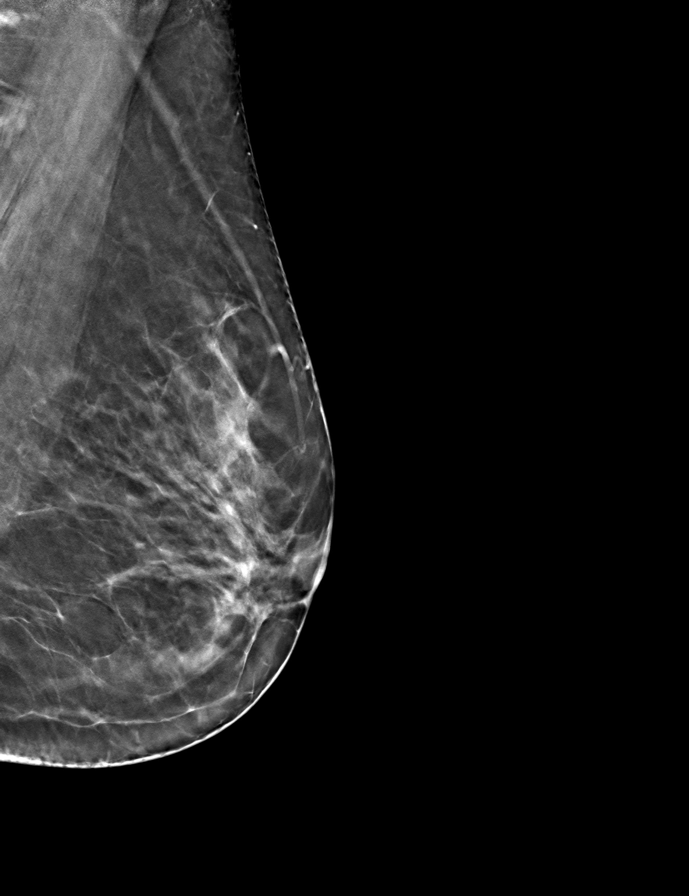

[L CC tomo · tomo slice 31/62.0]
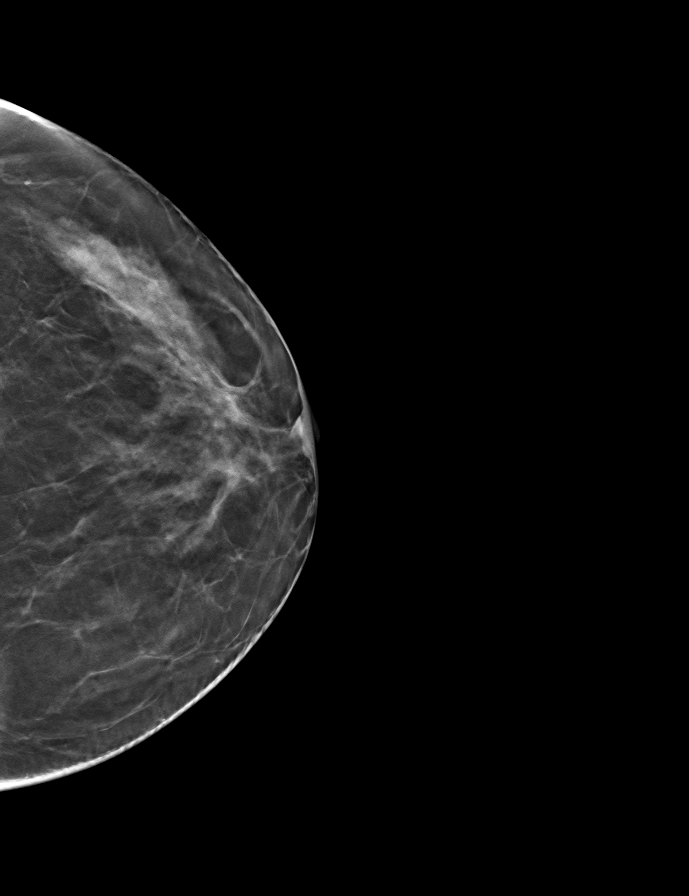

[R CC tomo · tomo slice 32/63.0]
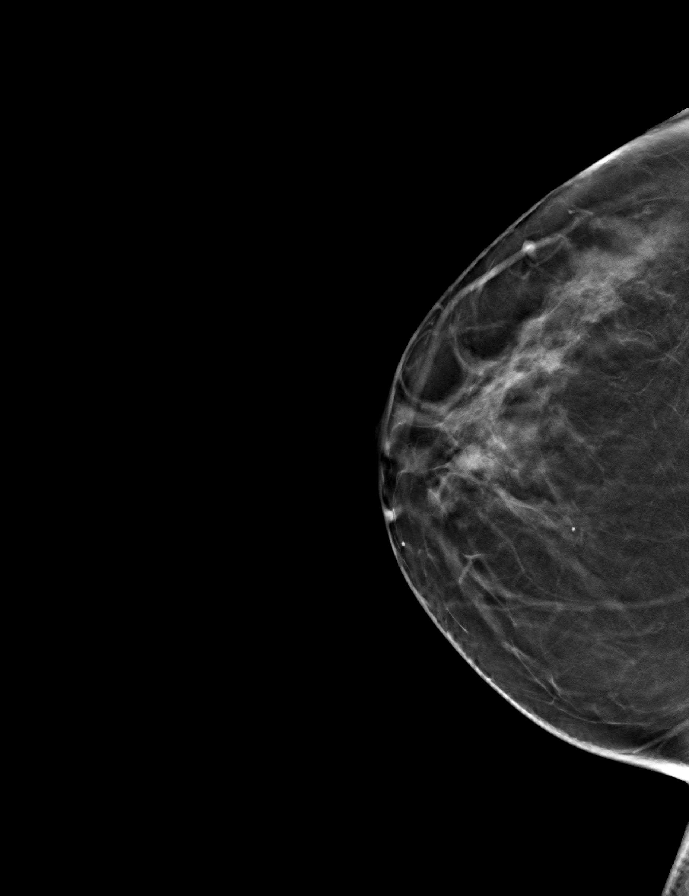

[9 of 24 positions shown; findings below may reference images not displayed]

ACR Breast Density Category b: There are scattered areas of
fibroglandular density.
FINDINGS: There are no findings suspicious for malignancy.
IMPRESSION: No mammographic evidence of malignancy. A result letter of this
screening mammogram will be mailed directly to the patient.

RECOMMENDATION:
Screening mammogram in one year. (Code:51-O-LD2)

BI-RADS CATEGORY  1: Negative.

## 2023-12-12 ENCOUNTER — Ambulatory Visit: Admitting: Internal Medicine

## 2024-01-01 ENCOUNTER — Other Ambulatory Visit: Payer: Self-pay | Admitting: Internal Medicine

## 2024-01-01 DIAGNOSIS — E039 Hypothyroidism, unspecified: Secondary | ICD-10-CM

## 2024-01-02 NOTE — Telephone Encounter (Signed)
 Requested by interface surescripts. Future visit 06/26/24.  Requested Prescriptions  Pending Prescriptions Disp Refills   levothyroxine  (SYNTHROID ) 75 MCG tablet [Pharmacy Med Name: LEVOTHYROXINE  75 MCG TABLET] 90 tablet 1    Sig: TAKE 1 TABLET BY MOUTH EVERY DAY     Endocrinology:  Hypothyroid Agents Failed - 01/02/2024  4:05 PM      Failed - TSH in normal range and within 360 days    TSH  Date Value Ref Range Status  05/24/2023 5.940 (H) 0.450 - 4.500 uIU/mL Final         Passed - Valid encounter within last 12 months    Recent Outpatient Visits           6 months ago Annual physical exam   [facility] at [facility], Leita DEL, MD   7 months ago Irregular menstrual bleeding   [facility] at [facility], Leita DEL, MD

## 2024-06-26 ENCOUNTER — Encounter: Admitting: Student
